# Patient Record
Sex: Male | Born: 1980 | Race: White | Hispanic: No | Marital: Married | Smoking: Never smoker
Health system: Southern US, Community
[De-identification: ages and names within clinical notes are randomized; demographics above are authoritative.]

## PROBLEM LIST (undated history)

## (undated) DIAGNOSIS — S3992XA Unspecified injury of lower back, initial encounter: Secondary | ICD-10-CM

## (undated) DIAGNOSIS — K253 Acute gastric ulcer without hemorrhage or perforation: Secondary | ICD-10-CM

## (undated) DIAGNOSIS — N200 Calculus of kidney: Secondary | ICD-10-CM

---
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
## 2011-07-20 ENCOUNTER — Encounter (HOSPITAL_BASED_OUTPATIENT_CLINIC_OR_DEPARTMENT_OTHER): Payer: Self-pay | Admitting: *Deleted

## 2011-07-20 ENCOUNTER — Emergency Department (HOSPITAL_BASED_OUTPATIENT_CLINIC_OR_DEPARTMENT_OTHER)
Admission: EM | Admit: 2011-07-20 | Discharge: 2011-07-20 | Disposition: A | Discharge status: Home / Self Care | Payer: No Typology Code available for payment source | Attending: Emergency Medicine | Admitting: Emergency Medicine

## 2011-07-20 DIAGNOSIS — J029 Acute pharyngitis, unspecified: Secondary | ICD-10-CM | POA: Insufficient documentation

## 2011-07-20 DIAGNOSIS — R300 Dysuria: Secondary | ICD-10-CM | POA: Insufficient documentation

## 2011-07-20 LAB — URINALYSIS, ROUTINE W REFLEX MICROSCOPIC
Glucose, UA: NEGATIVE mg/dL
Ketones, ur: 15 mg/dL — AB
Leukocytes, UA: NEGATIVE
Nitrite: NEGATIVE
Protein, ur: 30 mg/dL — AB
Specific Gravity, Urine: 1.03 (ref 1.005–1.030)
Urobilinogen, UA: 1 mg/dL (ref 0.0–1.0)
pH: 6 (ref 5.0–8.0)

## 2011-07-20 LAB — URINE MICROSCOPIC-ADD ON

## 2011-07-20 LAB — RAPID STREP SCREEN (MED CTR MEBANE ONLY): Streptococcus, Group A Screen (Direct): NEGATIVE

## 2011-07-20 MED ORDER — IBUPROFEN 800 MG PO TABS: 800.0000 mg | ORAL_TABLET | Freq: Three times a day (TID) | ORAL | Status: AC | PRN

## 2011-07-20 MED ORDER — CEPHALEXIN 500 MG PO CAPS: 500.0000 mg | ORAL_CAPSULE | Freq: Four times a day (QID) | ORAL | Status: AC

## 2011-07-20 NOTE — ED Notes (Signed)
Pt also c/o dysuria and low back pain x Wednesday.

## 2011-07-20 NOTE — ED Provider Notes (Signed)
Medical screening examination/treatment/procedure(s) were performed by non-physician practitioner and as supervising physician I was immediately available for consultation/collaboration.    Nelia Shi, MD 07/20/11 1247

## 2011-07-20 NOTE — ED Notes (Signed)
Pt amb to room 11 with quick steady gait in nad. Pt reports sore throat and fevers since last wed. Rapid strep obtained while pt being triaged.

## 2011-07-20 NOTE — ED Provider Notes (Signed)
History     CSN: [id number]  Arrival date & time 07/20/11  1119   First MD Initiated Contact with Patient 07/20/11 1208      Chief Complaint  Patient presents with  . Sore Throat  . Fever  . Dysuria    (Consider location/radiation/quality/duration/timing/severity/associated sxs/prior treatment) HPI Comments: Patient presents with fever and sore throat for the past 2 days. He has not had ear pain, runny nose, cough. Fever has been up to 102F. He has treated at home with ibuprofen which helps him sleep. In addition to the symptoms the patient also states that he has had bilateral lower back pain and some mild dysuria. He states that his urine has appeared dark. No generalized myalgias. Onset was gradual. Courses constant. No history of other medical problems. No history of sick contacts.  Patient is a 31 y.o. male presenting with pharyngitis. The history is provided by the patient.  Sore Throat This is a new problem. The current episode started in the past 7 days. The problem occurs constantly. The problem has been unchanged. Associated symptoms include a fever (to 102F), a sore throat, swollen glands and urinary symptoms. Pertinent negatives include no abdominal pain, chest pain, coughing, headaches, myalgias, nausea, neck pain, rash or vomiting. The symptoms are aggravated by swallowing. He has tried NSAIDs for the symptoms. The treatment provided mild relief.    History reviewed. No pertinent past medical history.  History reviewed. No pertinent past surgical history.  History reviewed. No pertinent family history.  History  Substance Use Topics  . Smoking status: Never Smoker   . Smokeless tobacco: Not on file  . Alcohol Use: No      Review of Systems  Constitutional: Positive for fever (to 102F).  HENT: Positive for sore throat. Negative for ear pain, rhinorrhea, trouble swallowing and neck pain.   Eyes: Negative for redness.  Respiratory: Negative for cough.     Cardiovascular: Negative for chest pain.  Gastrointestinal: Negative for nausea, vomiting, abdominal pain and diarrhea.  Genitourinary: Negative for dysuria, hematuria, flank pain and discharge.  Musculoskeletal: Positive for back pain. Negative for myalgias.  Skin: Negative for rash.  Neurological: Negative for headaches.  Hematological: Positive for adenopathy.    Allergies  Review of patient's allergies indicates no known allergies.  Home Medications  No current outpatient prescriptions on file.  BP 104/63  Pulse 94  Temp 101 F (38.3 C) (Oral)  Resp 14  Ht 5\' 7"  (1.702 m)  Wt 140 lb (63.504 kg)  BMI 21.93 kg/m2  SpO2 100%  Physical Exam  Nursing note and vitals reviewed. Constitutional: He appears well-developed and well-nourished.  HENT:  Head: Normocephalic and atraumatic.  Right Ear: Hearing, tympanic membrane and ear canal normal.  Left Ear: Hearing, tympanic membrane and ear canal normal.  Nose: Nose normal. No mucosal edema.  Mouth/Throat: Mucous membranes are not dry. No uvula swelling. Oropharyngeal exudate and posterior oropharyngeal erythema present. No posterior oropharyngeal edema or tonsillar abscesses.       No peritonsillar abscess  Eyes: Conjunctivae are normal. Pupils are equal, round, and reactive to light. Right eye exhibits no discharge. Left eye exhibits no discharge.  Neck: Normal range of motion. Neck supple.  Cardiovascular: Normal rate, regular rhythm and normal heart sounds.   Pulmonary/Chest: Effort normal and breath sounds normal.  Abdominal: Soft. There is no tenderness.  Lymphadenopathy:    He has cervical adenopathy.  Neurological: He is alert.  Skin: Skin is warm and dry.  Psychiatric: He  has a normal mood and affect.    ED Course  Procedures (including critical care time)  Labs Reviewed  URINALYSIS, ROUTINE W REFLEX MICROSCOPIC - Abnormal; Notable for the following:    Color, Urine AMBER (*)  BIOCHEMICALS MAY BE AFFECTED BY  COLOR   Hgb urine dipstick MODERATE (*)     Bilirubin Urine SMALL (*)     Ketones, ur 15 (*)     Protein, ur 30 (*)     All other components within normal limits  URINE MICROSCOPIC-ADD ON - Abnormal; Notable for the following:    Bacteria, UA MANY (*)     All other components within normal limits  RAPID STREP SCREEN  URINE CULTURE   No results found.   1. Pharyngitis   2. Dysuria     12:29 PM Patient seen and examined. Work-up initiated. Medications ordered.   Vital signs reviewed and are as follows: Filed Vitals:   07/20/11 1131  BP: 104/63  Pulse: 94  Temp: 101 F (38.3 C)  Resp: 14   Patient urged to return with worsening symptoms, persistent high fever or vomiting. Urged return if not feeling better in 3 days. He states understanding and agrees with plan.    MDM  [facility] 4/4 -- will treat even with neg strep. No concern for peritonsillar abscess or retropharyngeal abscess.  Patient with dysuria, some blood, + bacteria -- however 0-2 WBC. Given symptoms will treat, send culture. Doubt post-streptococcal glomerular nephritis given bacteria noted, pain, 2 days since onset of symptoms.         Renne Crigler, [place] 07/20/11 1235

## 2011-07-21 LAB — URINE CULTURE
Colony Count: NO GROWTH
Culture: NO GROWTH

## 2012-03-11 ENCOUNTER — Other Ambulatory Visit: Payer: Self-pay | Admitting: Occupational Medicine

## 2012-03-11 ENCOUNTER — Ambulatory Visit: Payer: Self-pay

## 2012-03-11 DIAGNOSIS — M25532 Pain in left wrist: Secondary | ICD-10-CM

## 2012-12-01 ENCOUNTER — Ambulatory Visit (INDEPENDENT_AMBULATORY_CARE_PROVIDER_SITE_OTHER): Payer: No Typology Code available for payment source | Admitting: Internal Medicine

## 2012-12-01 VITALS — BP 112/72 | HR 78 | Temp 98.8°F | Resp 18 | Ht 65.75 in | Wt 138.0 lb

## 2012-12-01 DIAGNOSIS — M546 Pain in thoracic spine: Secondary | ICD-10-CM

## 2012-12-01 DIAGNOSIS — M542 Cervicalgia: Secondary | ICD-10-CM

## 2012-12-01 MED ORDER — MELOXICAM 15 MG PO TABS: 15.0000 mg | ORAL_TABLET | Freq: Every day | ORAL | Status: DC

## 2012-12-01 MED ORDER — PREDNISONE 20 MG PO TABS: ORAL_TABLET | ORAL | Status: DC

## 2012-12-01 MED ORDER — TRAMADOL HCL 50 MG PO TABS: 100.0000 mg | ORAL_TABLET | Freq: Two times a day (BID) | ORAL | Status: DC

## 2012-12-01 MED ORDER — CYCLOBENZAPRINE HCL 10 MG PO TABS: 10.0000 mg | ORAL_TABLET | Freq: Every day | ORAL | Status: DC

## 2012-12-01 NOTE — Progress Notes (Signed)
Subjective:    Patient ID: James Hines, male    DOB: 08-27-1980, 32 y.o.   MRN: [id number] This chart was scribed for James Chick, MD by Clydene Laming, ED Scribe. This patient was seen in room 14 and the patient's care was started at 9:05 PM. HPI HPI Comments: James Hines is a 32 y.o. male who presents to the [facility] complaining of back pain radiating to the neck onset last week. He states symptoms worsened this weekend and he could not work as he normally could today. He was havingsome trouble bending over and picking up items. Pt denies any trauma that could have caused the pain. Pt is having some pain when sleeping. Pt denies sob. Denies prior mvc crash. He sometimes has pain when taking deep breaths. Pt does not have a hx of back pain and states he does not usually have problems with his neck.    There are no active problems to display for this patient.  History reviewed. No pertinent past medical history. History reviewed. No pertinent past surgical history. No Known Allergies Prior to Admission medications   Not on File   nonsmoker married    Review of Systems  Respiratory: Negative for shortness of breath.   Musculoskeletal: Positive for back pain. Negative for gait problem, neck pain and neck stiffness.  Neurological: Negative for weakness and numbness.       Objective:   Physical Exam  Nursing note and vitals reviewed. Constitutional: He is oriented to person, place, and time. He appears well-developed and well-nourished. No distress.  HENT:  Head: Normocephalic.  Mouth/Throat: Oropharynx is clear and moist.  Neck: Normal range of motion.  Full ROM without pain  Cardiovascular: Normal rate.   Pulmonary/Chest: Effort normal.  Musculoskeletal: He exhibits tenderness.  Full range of motion of the T-spine and L-spine No tenderness to palpation of the spinous processes of the T-spine or L-spine Mild tenderness to palpation of the L  scapular border Tender to palp deep in L paracerv muscles Neck rom =pain w/ flex/extens and rot but has full rom  Neurological: He is alert and oriented to person, place, and time. He has normal reflexes. No cranial nerve deficit.  Speech is clear and goal oriented, follows commands Normal strength in upper and lower extremities bilaterally including dorsiflexion and plantar flexion, strong and equal grip strength Sensation normal to light and sharp touch Moves extremities without ataxia, coordination intact Normal gait Normal balance   Psychiatric: He has a normal mood and affect.      Assessment & Plan:  Neck pain with Thoracic back pain ? Radicular versus strain with spasm  Heat/gentle rom/light duty/ Reck 1 week Meds ordered this encounter  Medications  . predniSONE (DELTASONE) 20 MG tablet    Sig: 3/3/2/2/1/1 single daily dose for 6 days    Dispense:  12 tablet    Refill:  0  . meloxicam (MOBIC) 15 MG tablet    Sig: Take 1 tablet (15 mg total) by mouth daily.    Dispense:  30 tablet    Refill:  0  . cyclobenzaprine (FLEXERIL) 10 MG tablet    Sig: Take 1 tablet (10 mg total) by mouth at bedtime.    Dispense:  30 tablet    Refill:  0  . traMADol (ULTRAM) 50 MG tablet    Sig: Take 2 tablets (100 mg total) by mouth 2 (two) times daily. For pain    Dispense:  14 tablet  Refill:  0     I have completed the patient encounter in its entirety as documented by the scribe, with editing by me where necessary. Macenzie Burford P. Merla Riches, M.D.

## 2013-01-18 ENCOUNTER — Ambulatory Visit (INDEPENDENT_AMBULATORY_CARE_PROVIDER_SITE_OTHER): Payer: 59 | Admitting: Internal Medicine

## 2013-01-18 VITALS — BP 100/68 | HR 70 | Temp 98.4°F | Resp 16 | Ht 65.0 in | Wt 132.6 lb

## 2013-01-18 DIAGNOSIS — R519 Headache, unspecified: Secondary | ICD-10-CM

## 2013-01-18 DIAGNOSIS — R51 Headache: Secondary | ICD-10-CM

## 2013-01-18 MED ORDER — ONDANSETRON HCL 4 MG PO TABS: 4.0000 mg | ORAL_TABLET | Freq: Three times a day (TID) | ORAL | Status: DC | PRN

## 2013-01-18 MED ORDER — SUMATRIPTAN SUCCINATE 100 MG PO TABS: ORAL_TABLET | ORAL | Status: DC

## 2013-01-18 MED ORDER — BUTALBITAL-APAP-CAFFEINE 50-325-40 MG PO TABS
1.0000 | ORAL_TABLET | Freq: Two times a day (BID) | ORAL | Status: DC | PRN
Start: 2013-01-18 — End: 2016-02-02

## 2013-01-18 NOTE — Patient Instructions (Signed)
1-2 tabs as needed for HA 2-3 times a day of the esgic  Zofran as for the nausea associated with the headache you should be feeling much better within the week and if the headaches return you'll have a chance to try Imitrex at the onset of the headache It is okay to use Esgic and Zofran if Imitrex doesn't work Recheck in 3-4 weeks if this headache syndrome has not been completely controlled

## 2013-01-18 NOTE — Progress Notes (Signed)
Subjective:    Patient ID: James Hectoraron L Roepke, male    DOB: 03/10/1980, 33 y.o.   MRN: [id number]  HPI This chart was scribed for [facility] Hospital[facility] Doolittle-MD, by Ladona Ridgelaylor Day, Scribe. This patient was seen in room 9 and the patient's care was started at 3:29 PM.  HPI Comments: James Hines is a 33 y.o. male who presents to the [facility] w/hx of headaches complaining of waxing and waning right frontal HA, onset 3 weeks ago. He reports that these episodes of headaches are similar to his previous episodes except he has never had them last for this long. He states when HA is at its worse, movement makes it worse and it feels like a stabbing pain across his forehead. He reports associated nausea, dizziness at worse times of his HA, reports some intermittent nasal congesiton. He reports some associated neck stiffness and muslce tension at time of HA (decreased appetite secondary to HA). He denies any precipitating factors to his HA and denies any visual aura at the onset of his HA, no auditory problems.   He has tried goody powder and excedrin w/out any relief. He states HA interferes w/his sleeping as well. He reports his last episode of HA was about 1 year ago but not as bad as this episode. He denies any blurry vision (he gets some blurry vision when he blinks), he denies sore throat, fever, sneezing, cough. He has not had the flu this year (no fevers past couple of months).  His wife reports that he has been snoring more so than normal lately and also has periods of ? apnea. He reports that at work lately, he has been more tired than normal and also feels tired while driving and falls asleep easier while resting at home.   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    There are no active problems to display for this patient.   History reviewed. No pertinent past surgical history.  History reviewed. No pertinent family history.  History   Social  History  . Marital Status: Married    Spouse Name: N/A    Number of Children: N/A  . Years of Education: N/A   Occupational History  . Not on file.   Social History Main Topics  . Smoking status: Never Smoker   . Smokeless tobacco: Not on file  . Alcohol Use: No  . Drug Use: No  . Sexual Activity: Not on file   Other Topics Concern  . Not on file   Social History Narrative  . No narrative on file    No Known Allergies   Review of Systems  Constitutional: Positive for appetite change. Negative for fever and chills.  HENT: Negative for congestion, rhinorrhea and sore throat.   Respiratory: Negative for cough and shortness of breath.   Cardiovascular: Negative for chest pain.  Gastrointestinal: Negative for nausea, vomiting and abdominal pain.  Musculoskeletal: Negative for back pain.  Neurological: Positive for dizziness and headaches.       Objective:   Physical Exam  Nursing note and vitals reviewed. Constitutional: He is oriented to person, place, and time. He appears well-developed and well-nourished. No distress.  HENT:  Head: Normocephalic and atraumatic.  Right Ear: External ear normal.  Left Ear: External ear normal.  Nose: Nose normal.  Mouth/Throat: Oropharynx is clear and moist.  Eyes: Conjunctivae and EOM are normal. Pupils are equal, round, and reactive to light. Right eye  exhibits no discharge. Left eye exhibits no discharge.  Neck: Normal range of motion. Neck supple. No thyromegaly present.  Cardiovascular: Normal rate, regular rhythm, normal heart sounds and intact distal pulses.   No murmur heard. Pulmonary/Chest: Effort normal and breath sounds normal. No respiratory distress.  Musculoskeletal: Normal range of motion. He exhibits no edema.  Lymphadenopathy:    He has no cervical adenopathy.  Neurological: He is alert and oriented to person, place, and time. He has normal reflexes. No cranial nerve deficit. He exhibits normal muscle tone.  Coordination normal.  Skin: Skin is warm and dry. No rash noted.  Psychiatric: He has a normal mood and affect. His behavior is normal. Judgment and thought content normal.    Triage Vitals: BP 100/68  Pulse 70  Temp(Src) 98.4 F (36.9 C) (Oral)  Resp 16  Ht 5\' 5"  (1.651 m)  Wt 132 lb 9.6 oz (60.147 kg)  BMI 22.07 kg/m2  SpO2 100%         Assessment & Plan:  I have completed the patient encounter in its entirety as documented by the scribe, with editing by me where necessary. Robert P. Merla Riches, M.D.  HA (headache) ? Atypical migraine  Meds ordered this encounter  Medications  . SUMAtriptan (IMITREX) 100 MG tablet    Sig: Take 1 tablet within 15-30 minutes of the onset of a headache. No more than 2 tablets in 24 hours    Dispense:  2 tablet    Refill:  0  . ondansetron (ZOFRAN) 4 MG tablet    Sig: Take 1 tablet (4 mg total) by mouth every 8 (eight) hours as needed for nausea or vomiting.    Dispense:  20 tablet    Refill:  0  . butalbital-acetaminophen-caffeine (ESGIC) 50-325-40 MG per tablet    Sig: Take 1-2 tablets by mouth 2 (two) times daily as needed for headache.    Dispense:  14 tablet    Refill:  0   Try to stabilize headache with acute treatment with Esgic and Zofran and then use Imitrex at the onset of his next series of headaches Followup in 4 weeks/sooner if worse

## 2013-01-30 ENCOUNTER — Ambulatory Visit (INDEPENDENT_AMBULATORY_CARE_PROVIDER_SITE_OTHER): Payer: 59 | Admitting: Emergency Medicine

## 2013-01-30 VITALS — BP 120/68 | HR 81 | Temp 98.0°F | Resp 16 | Ht 65.0 in | Wt 132.0 lb

## 2013-01-30 DIAGNOSIS — G43109 Migraine with aura, not intractable, without status migrainosus: Secondary | ICD-10-CM

## 2013-01-30 DIAGNOSIS — G44019 Episodic cluster headache, not intractable: Secondary | ICD-10-CM

## 2013-01-30 MED ORDER — ELETRIPTAN HYDROBROMIDE 40 MG PO TABS: 40.0000 mg | ORAL_TABLET | ORAL | Status: DC | PRN

## 2013-01-30 NOTE — Progress Notes (Signed)
[facility] [address], [place] [address] [phone]  Date:  01/30/2013   Name:  James Hines   DOB:  1980-04-15   MRN:  [id number]  PCP:  No primary provider on file.    Chief Complaint: Headache   History of Present Illness:  James Hines is a 33 y.o. very pleasant male patient who presents with the following:  History of migraine headaches through adult life.  Says he has not experienced a severe headache in the past year.  Says his headaches come on located in the right temporofrontal region and pulsate.  No vomiting but nauseated. No LOC or antecedent illness or injury to head.  No fever or chills.  Was in two weeks ago and put on imitrex, zofran and esgic with no improvement in his headaches.  Says the imitrex makes it worse.  Has photophobia and loud noises bother him.  No neuro or visual changes associated with headaches.  No improvement with over the counter medications or other home remedies. Denies other complaint or health concern today.   There are no active problems to display for this patient.   History reviewed. No pertinent past medical history.  History reviewed. No pertinent past surgical history.  History  Substance Use Topics  . Smoking status: Never Smoker   . Smokeless tobacco: Not on file  . Alcohol Use: No    History reviewed. No pertinent family history.  No Known Allergies  Medication list has been reviewed and updated.  Current Outpatient Prescriptions on File Prior to Visit  Medication Sig Dispense Refill  . butalbital-acetaminophen-caffeine (ESGIC) 50-325-40 MG per tablet Take 1-2 tablets by mouth 2 (two) times daily as needed for headache.  14 tablet  0  . ondansetron (ZOFRAN) 4 MG tablet Take 1 tablet (4 mg total) by mouth every 8 (eight) hours as needed for nausea or vomiting.  20 tablet  0  . SUMAtriptan (IMITREX) 100 MG tablet Take 1 tablet within 15-30 minutes of the onset of a headache. No more than 2  tablets in 24 hours  2 tablet  0  . cyclobenzaprine (FLEXERIL) 10 MG tablet Take 1 tablet (10 mg total) by mouth at bedtime.  30 tablet  0  . meloxicam (MOBIC) 15 MG tablet Take 1 tablet (15 mg total) by mouth daily.  30 tablet  0  . predniSONE (DELTASONE) 20 MG tablet 3/3/2/2/1/1 single daily dose for 6 days  12 tablet  0  . traMADol (ULTRAM) 50 MG tablet Take 2 tablets (100 mg total) by mouth 2 (two) times daily. For pain  14 tablet  0   No current facility-administered medications on file prior to visit.    Review of Systems:  As per HPI, otherwise negative.    Physical Examination: Filed Vitals:   01/30/13 1551  BP: 120/68  Pulse: 81  Temp: 98 F (36.7 C)  Resp: 16   Filed Vitals:   01/30/13 1551  Height: 5\' 5"  (1.651 m)  Weight: 132 lb (59.875 kg)   Body mass index is 21.97 kg/(m^2). Ideal Body Weight: Weight in (lb) to have BMI = 25: 149.9  GEN: WDWN, NAD, Non-toxic, A & O x 3 HEENT: Atraumatic, Normocephalic. Neck supple. No masses, No LAD. Ears and Nose: No external deformity. CV: RRR, No M/G/R. No JVD. No thrill. No extra heart sounds. PULM: CTA B, no wheezes, crackles, rhonchi. No retractions. No resp. distress. No accessory muscle use. ABD: S, NT, ND, +  BS. No rebound. No HSM. EXTR: No c/c/e NEURO Normal gait.  PSYCH: Normally interactive. Conversant. Not depressed or anxious appearing.  Calm demeanor.    Assessment and Plan: Cluster headache relpax   Signed,  Phillips OdorJeffery Makayle Krahn, MD

## 2013-01-30 NOTE — Patient Instructions (Signed)
Cluster Cluster Headache Cluster headaches are recognized by their pattern of deep, intense head pain. They normally occur on one side of your head, but they may "switch sides" in subsequent episodes. Typically, cluster headaches:   Are severe in nature.   Occur repeatedly over weeks to months and are followed by periods of no headaches.   Can last from 15 minutes to 3 hours.   Occur at the same time each day, often at night.   Occur several times a day. CAUSES The exact cause of cluster headaches is not known. Alcohol use may be associated with cluster headaches. SIGNS AND SYMPTOMS   Severe pain that begins in or around your eye or temple.   One-sided head pain.   Feeling sick to your stomach (nauseous).   Sensitivity to light.   Runny nose.   Eye redness, tearing, and nasal stuffiness on the side of your head where you are experiencing pain.   Sweaty, pale skin of the face.   Droopy or swollen eyelid.   Restlessness. DIAGNOSIS  Cluster headaches are diagnosed based on symptoms and a physical exam. Your health care provider may order a CT scan or an MRI of your head or lab tests to see if your headaches are caused by other medical conditions.  TREATMENT   Medicines for pain relief and to prevent recurrent attacks. Some people may need a combination of medicines.  Oxygen for pain relief.   Biofeedback programs to help reduce headache pain.  It may be helpful to keep a headache diary. This may help you find a trend for what is triggering your headaches. Your health care provider can develop a treatment plan.  HOME CARE INSTRUCTIONS  During cluster periods:   Follow a regular sleep schedule. Do not vary the amount and time that you sleep from day to day. It is important to stay on the same schedule during a cluster period to help prevent headaches.   Avoid alcohol.   Stop smoking if you smoke.  SEEK MEDICAL CARE IF:  You have any changes from your  previous cluster headaches either in intensity or frequency.   You are not getting relief from medicines you are taking.  SEEK IMMEDIATE MEDICAL CARE IF:   You faint.   You have weakness or numbness, especially on one side of your body or face.   You have double vision.   You have nausea or vomiting that is not relieved within several hours.   You cannot keep your balance or have difficulty talking or walking.   You have neck pain or stiffness.   You have a fever. MAKE SURE YOU:  Understand these instructions.   Will watch your condition.   Will get help right away if you are not doing well or get worse. Document Released: 01/01/2005 Document Revised: 10/22/2012 Document Reviewed: 07/24/2012 [facility] Patient Information 2014 [employer], [place].

## 2013-07-15 ENCOUNTER — Ambulatory Visit (INDEPENDENT_AMBULATORY_CARE_PROVIDER_SITE_OTHER): Payer: 59 | Admitting: Emergency Medicine

## 2013-07-15 VITALS — BP 122/84 | HR 70 | Temp 97.8°F | Resp 16 | Ht 65.25 in | Wt 130.6 lb

## 2013-07-15 DIAGNOSIS — G5680 Other specified mononeuropathies of unspecified upper limb: Secondary | ICD-10-CM

## 2013-07-15 DIAGNOSIS — G5682 Other specified mononeuropathies of left upper limb: Secondary | ICD-10-CM

## 2013-07-15 MED ORDER — CYCLOBENZAPRINE HCL 10 MG PO TABS: 10.0000 mg | ORAL_TABLET | Freq: Three times a day (TID) | ORAL | Status: DC

## 2013-07-15 MED ORDER — ACETAMINOPHEN-CODEINE #3 300-30 MG PO TABS: 1.0000 | ORAL_TABLET | ORAL | Status: DC | PRN

## 2013-07-15 MED ORDER — NAPROXEN SODIUM 550 MG PO TABS: 550.0000 mg | ORAL_TABLET | Freq: Two times a day (BID) | ORAL | Status: AC

## 2013-07-15 NOTE — Patient Instructions (Signed)

## 2013-07-15 NOTE — Progress Notes (Signed)
[facility] [address], [place] [phone]  Date:  07/15/2013   Name:  James Hines   DOB:  11/30/1980   MRN:  [id number]  PCP:  No PCP Per Patient    Chief Complaint: Back Pain   History of Present Illness:  James Hines is a 33 y.o. very pleasant male patient who presents with the following:  Awoke in the night with pain in left posterior shoulder.  Kept him from sleeping.  Pain increases with movement and deep breath.  Decreases with rest.  No radiation.  Denies injury or specific overuse.  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  No cough or fever.  No wheezing or shortness of breath. No nausea or vomiting.  No stool change.  No improvement with over the counter medications or other home remedies. Denies other complaint or health concern today.   There are no active problems to display for this patient.   History reviewed. No pertinent past medical history.  History reviewed. No pertinent past surgical history.  History  Substance Use Topics  . Smoking status: Never Smoker   . Smokeless tobacco: Not on file  . Alcohol Use: No    History reviewed. No pertinent family history.  No Known Allergies  Medication list has been reviewed and updated.  Current Outpatient Prescriptions on File Prior to Visit  Medication Sig Dispense Refill  . butalbital-acetaminophen-caffeine (ESGIC) 50-325-40 MG per tablet Take 1-2 tablets by mouth 2 (two) times daily as needed for headache.  14 tablet  0  . cyclobenzaprine (FLEXERIL) 10 MG tablet Take 1 tablet (10 mg total) by mouth at bedtime.  30 tablet  0  . eletriptan (RELPAX) 40 MG tablet Take 1 tablet (40 mg total) by mouth as needed for migraine or headache. One tablet by mouth at onset of headache. May repeat in 2 hours if headache persists or recurs.  10 tablet  5  . meloxicam (MOBIC) 15 MG tablet Take 1 tablet (15 mg total) by mouth daily.  30 tablet  0  . ondansetron (ZOFRAN) 4 MG  tablet Take 1 tablet (4 mg total) by mouth every 8 (eight) hours as needed for nausea or vomiting.  20 tablet  0  . predniSONE (DELTASONE) 20 MG tablet 3/3/2/2/1/1 single daily dose for 6 days  12 tablet  0  . SUMAtriptan (IMITREX) 100 MG tablet Take 1 tablet within 15-30 minutes of the onset of a headache. No more than 2 tablets in 24 hours  2 tablet  0  . traMADol (ULTRAM) 50 MG tablet Take 2 tablets (100 mg total) by mouth 2 (two) times daily. For pain  14 tablet  0   No current facility-administered medications on file prior to visit.    Review of Systems:  As per HPI, otherwise negative.    Physical Examination: Filed Vitals:   07/15/13 0834  BP: 122/84  Pulse: 70  Temp: 97.8 F (36.6 C)  Resp: 16   Filed Vitals:   07/15/13 0834  Height: 5' 5.25" (1.657 m)  Weight: 130 lb 9.6 oz (59.24 kg)   Body mass index is 21.58 kg/(m^2). Ideal Body Weight: Weight in (lb) to have BMI = 25: 151.1   GEN: WDWN, NAD, Non-toxic, Alert & Oriented x 3 HEENT: Atraumatic, Normocephalic.  Ears and Nose: No external deformity. EXTR: No clubbing/cyanosis/edema NEURO: Normal gait.  PSYCH: Normally interactive. Conversant. Not depressed or anxious appearing.  Calm demeanor.  BACK:  Left upper back tenderness inferior and medial to angle of scapula. No ecchymosis or abrasion.   CHEST clear BS= COR:  RRR no m  Assessment and Plan: Scapulocostal syndrome Anaprox Flexeril tyl #3 Ice  Signed,  Phillips OdorJeffery Zane Samson, MD

## 2014-09-06 ENCOUNTER — Ambulatory Visit (INDEPENDENT_AMBULATORY_CARE_PROVIDER_SITE_OTHER): Payer: 59 | Admitting: Emergency Medicine

## 2014-09-06 ENCOUNTER — Ambulatory Visit
Admission: RE | Admit: 2014-09-06 | Discharge: 2014-09-06 | Disposition: A | Discharge status: Home / Self Care | Payer: 59 | Source: Ambulatory Visit | Attending: Emergency Medicine | Admitting: Emergency Medicine

## 2014-09-06 ENCOUNTER — Ambulatory Visit (INDEPENDENT_AMBULATORY_CARE_PROVIDER_SITE_OTHER): Payer: 59

## 2014-09-06 ENCOUNTER — Telehealth: Payer: Self-pay | Admitting: Emergency Medicine

## 2014-09-06 VITALS — BP 122/80 | HR 69 | Temp 98.5°F | Resp 16 | Ht 65.0 in | Wt 138.4 lb

## 2014-09-06 DIAGNOSIS — R81 Glycosuria: Secondary | ICD-10-CM | POA: Diagnosis not present

## 2014-09-06 DIAGNOSIS — R10814 Left lower quadrant abdominal tenderness: Secondary | ICD-10-CM | POA: Diagnosis not present

## 2014-09-06 DIAGNOSIS — R109 Unspecified abdominal pain: Secondary | ICD-10-CM | POA: Diagnosis not present

## 2014-09-06 DIAGNOSIS — R319 Hematuria, unspecified: Secondary | ICD-10-CM | POA: Diagnosis not present

## 2014-09-06 DIAGNOSIS — R198 Other specified symptoms and signs involving the digestive system and abdomen: Secondary | ICD-10-CM

## 2014-09-06 LAB — POCT UA - MICROSCOPIC ONLY
Bacteria, U Microscopic: NEGATIVE
Casts, Ur, LPF, POC: NEGATIVE
Crystals, Ur, HPF, POC: NEGATIVE
Mucus, UA: POSITIVE
WBC, Ur, HPF, POC: NEGATIVE
Yeast, UA: NEGATIVE

## 2014-09-06 LAB — POCT CBC
Granulocyte percent: 61.4 %G (ref 37–80)
HCT, POC: 47.2 % (ref 43.5–53.7)
Hemoglobin: 15.5 g/dL (ref 14.1–18.1)
Lymph, poc: 1.9 (ref 0.6–3.4)
MCH, POC: 28.2 pg (ref 27–31.2)
MCHC: 32.9 g/dL (ref 31.8–35.4)
MCV: 85.7 fL (ref 80–97)
MID (cbc): 0.5 (ref 0–0.9)
MPV: 6.7 fL (ref 0–99.8)
POC Granulocyte: 3.9 (ref 2–6.9)
POC LYMPH PERCENT: 30.5 %L (ref 10–50)
POC MID %: 8.1 %M (ref 0–12)
Platelet Count, POC: 212 10*3/uL (ref 142–424)
RBC: 5.5 M/uL (ref 4.69–6.13)
RDW, POC: 12.2 %
WBC: 6.3 10*3/uL (ref 4.6–10.2)

## 2014-09-06 LAB — POCT URINALYSIS DIPSTICK
Bilirubin, UA: NEGATIVE
Glucose, UA: 100
Ketones, UA: NEGATIVE
Leukocytes, UA: NEGATIVE
Nitrite, UA: NEGATIVE
Protein, UA: NEGATIVE
Spec Grav, UA: 1.025
Urobilinogen, UA: 0.2
pH, UA: 6

## 2014-09-06 LAB — GLUCOSE, POCT (MANUAL RESULT ENTRY): POC Glucose: 97 mg/dl (ref 70–99)

## 2014-09-06 NOTE — Patient Instructions (Signed)
Hematuria  Hematuria is blood in your urine. It can be caused by a bladder infection, kidney infection, prostate infection, kidney stone, or cancer of your urinary tract. Infections can usually be treated with medicine, and a kidney stone usually will pass through your urine. If neither of these is the cause of your hematuria, further workup to find out the reason may be needed.  It is very important that you tell your health care provider about any blood you see in your urine, even if the blood stops without treatment or happens without causing pain. Blood in your urine that happens and then stops and then happens again can be a symptom of a very serious condition. Also, pain is not a symptom in the initial stages of many urinary cancers.  HOME CARE INSTRUCTIONS    Drink lots of fluid, 3-4 quarts a day. If you have been diagnosed with an infection, cranberry juice is especially recommended, in addition to large amounts of water.   Avoid caffeine, tea, and carbonated beverages because they tend to irritate the bladder.   Avoid alcohol because it may irritate the prostate.   Take all medicines as directed by your health care provider.   If you were prescribed an antibiotic medicine, finish it all even if you start to feel better.   If you have been diagnosed with a kidney stone, follow your health care provider's instructions regarding straining your urine to catch the stone.   Empty your bladder often. Avoid holding urine for long periods of time.   After a bowel movement, women should cleanse front to back. Use each tissue only once.   Empty your bladder before and after sexual intercourse if you are a male.  SEEK MEDICAL CARE IF:   You develop back pain.   You have a fever.   You have a feeling of sickness in your stomach (nausea) or vomiting.   Your symptoms are not better in 3 days. Return sooner if you are getting worse.  SEEK IMMEDIATE MEDICAL CARE IF:    You develop severe vomiting and are  unable to keep the medicine down.   You develop severe back or abdominal pain despite taking your medicines.   You begin passing a large amount of blood or clots in your urine.   You feel extremely weak or faint, or you pass out.  MAKE SURE YOU:    Understand these instructions.   Will watch your condition.   Will get help right away if you are not doing well or get worse.  Document Released: 01/01/2005 Document Revised: 05/18/2013 Document Reviewed: 09/01/2012  ExitCare Patient Information 2015 [employer]. This information is not intended to replace advice given to you by your health care provider. Make sure you discuss any questions you have with your health care provider.  Abdominal Pain  Many things can cause abdominal pain. Usually, abdominal pain is not caused by a disease and will improve without treatment. It can often be observed and treated at home. Your health care provider will do a physical exam and possibly order blood tests and X-rays to help determine the seriousness of your pain. However, in many cases, more time must pass before a clear cause of the pain can be found. Before that point, your health care provider may not know if you need more testing or further treatment.  HOME CARE INSTRUCTIONS   Monitor your abdominal pain for any changes. The following actions may help to alleviate any discomfort you are   experiencing:   Only take over-the-counter or prescription medicines as directed by your health care provider.   Do not take laxatives unless directed to do so by your health care provider.   Try a clear liquid diet (broth, tea, or water) as directed by your health care provider. Slowly move to a bland diet as tolerated.  SEEK MEDICAL CARE IF:   You have unexplained abdominal pain.   You have abdominal pain associated with nausea or diarrhea.   You have pain when you urinate or have a bowel movement.   You experience abdominal pain that wakes you in the night.   You have  abdominal pain that is worsened or improved by eating food.   You have abdominal pain that is worsened with eating fatty foods.   You have a fever.  SEEK IMMEDIATE MEDICAL CARE IF:    Your pain does not go away within 2 hours.   You keep throwing up (vomiting).   Your pain is felt only in portions of the abdomen, such as the right side or the left lower portion of the abdomen.   You pass bloody or black tarry stools.  MAKE SURE YOU:   Understand these instructions.    Will watch your condition.    Will get help right away if you are not doing well or get worse.   Document Released: 10/11/2004 Document Revised: 01/06/2013 Document Reviewed: 09/10/2012  ExitCare Patient Information 2015 [employer]. This information is not intended to replace advice given to you by your health care provider. Make sure you discuss any questions you have with your health care provider.

## 2014-09-06 NOTE — Progress Notes (Addendum)
Patient ID: James Hines, male   DOB: [birth year], 34 y.o.   MRN: [id number]    This chart was scribed for Earl Lites, MD by [facility], medical scribe at [facility].The patient was seen in exam room 04 and the patient's care was started at 1:58 PM.  Chief Complaint:  Chief Complaint  Patient presents with  . Back Pain    x 1 week ago  . Abdominal Pain   HPI: James Hines is a 34 y.o. male who reports to [facility] today complaining of lower back pain on the left side for one week. Yesterday he had urinary urgency and abdominal pain. He describes the pain as very "achy". No fever. No history of diverticulitis. Family history of kidney stones.  History reviewed. No pertinent past medical history. History reviewed. No pertinent past surgical history. Social History   Social History  . Marital Status: Married    Spouse Name: N/A  . Number of Children: N/A  . Years of Education: N/A   Social History Main Topics  . Smoking status: Never Smoker   . Smokeless tobacco: Current User  . Alcohol Use: No  . Drug Use: No  . Sexual Activity: Not Asked   Other Topics Concern  . None   Social History Narrative   History reviewed. No pertinent family history. No Known Allergies Prior to Admission medications   Medication Sig Start Date End Date Taking? Authorizing Provider  acetaminophen-codeine (TYLENOL #3) 300-30 MG per tablet Take 1-2 tablets by mouth every 4 (four) hours as needed. Patient not taking: Reported on 09/06/2014 07/15/13   Carmelina Dane, MD  butalbital-acetaminophen-caffeine [facility]) [phone] MG per tablet Take 1-2 tablets by mouth 2 (two) times daily as needed for headache. Patient not taking: Reported on 09/06/2014 01/18/13   Tonye Pearson, MD  cyclobenzaprine (FLEXERIL) 10 MG tablet Take 1 tablet (10 mg total) by mouth 3 (three) times daily. Patient not taking: Reported on 09/06/2014 07/15/13   Carmelina Dane, MD  eletriptan (RELPAX) 40 MG  tablet Take 1 tablet (40 mg total) by mouth as needed for migraine or headache. One tablet by mouth at onset of headache. May repeat in 2 hours if headache persists or recurs. Patient not taking: Reported on 09/06/2014 01/30/13   Carmelina Dane, MD  meloxicam (MOBIC) 15 MG tablet Take 1 tablet (15 mg total) by mouth daily. Patient not taking: Reported on 09/06/2014 12/01/12   Tonye Pearson, MD  ondansetron (ZOFRAN) 4 MG tablet Take 1 tablet (4 mg total) by mouth every 8 (eight) hours as needed for nausea or vomiting. Patient not taking: Reported on 09/06/2014 01/18/13   Tonye Pearson, MD  predniSONE (DELTASONE) 20 MG tablet 3/3/2/2/1/1 single daily dose for 6 days Patient not taking: Reported on 09/06/2014 12/01/12   Tonye Pearson, MD  SUMAtriptan (IMITREX) 100 MG tablet Take 1 tablet within 15-30 minutes of the onset of a headache. No more than 2 tablets in 24 hours Patient not taking: Reported on 09/06/2014 01/18/13   Tonye Pearson, MD  traMADol (ULTRAM) 50 MG tablet Take 2 tablets (100 mg total) by mouth 2 (two) times daily. For pain Patient not taking: Reported on 09/06/2014 12/01/12   Tonye Pearson, MD   ROS: The patient denies fevers, chills, night sweats, unintentional weight loss, chest pain, palpitations, wheezing, dyspnea on exertion, nausea, vomiting, abdominal pain, dysuria, hematuria, melena, numbness, weakness, or tingling.  All other systems have been  reviewed and were otherwise negative with the exception of those mentioned in the HPI and as above.    PHYSICAL EXAM: Filed Vitals:   09/06/14 1331  BP: 122/80  Pulse: 69  Temp: 98.5 F (36.9 C)  Resp: 16   Body mass index is 23.03 kg/(m^2).  General: Alert, no acute distress HEENT:  Normocephalic, atraumatic, oropharynx patent. Eye: [facility] Cardiovascular:  Regular rate and rhythm, no rubs murmurs or gallops.  No Carotid bruits, radial pulse intact. No pedal edema.  Respiratory: Clear to  auscultation bilaterally.  No wheezes, rales, or rhonchi.  No cyanosis, no use of accessory musculature Abdominal: No organomegaly, abdomen is soft, positive bowel sounds.  No masses. Tenderness deep left lower quadrant.  Musculoskeletal: Gait intact. No edema, tenderness Skin: No rashes. Neurologic: Facial musculature symmetric. Psychiatric: Patient acts appropriately throughout our interaction. Lymphatic: No cervical or submandibular lymphadenopathy Genitourinary/Anorectal: No acute findings  LABS: Results for orders placed or performed in visit on 09/06/14  POCT CBC  Result Value Ref Range   WBC 6.3 4.6 - 10.2 K/uL   Lymph, poc 1.9 0.6 - 3.4   POC LYMPH PERCENT 30.5 10 - 50 %L   MID (cbc) 0.5 0 - 0.9   POC MID % 8.1 0 - 12 %M   POC Granulocyte 3.9 2 - 6.9   Granulocyte percent 61.4 37 - 80 %G   RBC 5.50 4.69 - 6.13 M/uL   Hemoglobin 15.5 14.1 - 18.1 g/dL   HCT, POC 16.1 09.6 - 53.7 %   MCV 85.7 80 - 97 fL   MCH, POC 28.2 27 - 31.2 pg   MCHC 32.9 31.8 - 35.4 g/dL   RDW, POC 04.5 %   Platelet Count, POC 212 142 - 424 K/uL   MPV 6.7 0 - 99.8 fL  POCT UA - Microscopic Only  Result Value Ref Range   WBC, Ur, HPF, POC neg    RBC, urine, microscopic 0-3    Bacteria, U Microscopic neg    Mucus, UA positive    Epithelial cells, urine per micros 0-2    Crystals, Ur, HPF, POC neg    Casts, Ur, LPF, POC neg    Yeast, UA neg   POCT urinalysis dipstick  Result Value Ref Range   Color, UA yellow    Clarity, UA clear    Glucose, UA 100    Bilirubin, UA neg    Ketones, UA neg    Spec Grav, UA 1.025    Blood, UA moderate    pH, UA 6.0    Protein, UA neg    Urobilinogen, UA 0.2    Nitrite, UA neg    Leukocytes, UA Negative Negative   EKG/XRAY:   Primary read interpreted by Dr. Cleta Alberts at [facility]. No definite stone seen  ASSESSMENT/PLAN: CT neg. Will add CK and Bmet. Recheck hematuria this weekend.I personally performed the services described in this documentation, which was  scribed in my presence. The recorded information has been reviewed and is accurate.  Earl Lites, MD Gross sideeffects, risk and benefits, and alternatives of medications d/w patient. Patient is aware that all medications have potential sideeffects and we are unable to predict every sideeffect or drug-drug interaction that may occur.    Lesle Chris MD 09/06/2014 1:58 PM

## 2014-09-06 NOTE — Telephone Encounter (Signed)
Gave patient results. He will followup this weekend and repeat UA.Given cautions for fever and watch for rash. Recheck sooner if worsening

## 2014-09-06 NOTE — Addendum Note (Signed)
Addended by: Lesle Chris A on: 09/06/2014 05:43 PM   Modules accepted: Orders

## 2014-09-07 LAB — BASIC METABOLIC PANEL WITH GFR
BUN: 14 mg/dL (ref 7–25)
CO2: 28 mmol/L (ref 20–31)
Calcium: 9.3 mg/dL (ref 8.6–10.3)
Chloride: 101 mmol/L (ref 98–110)
Creat: 0.92 mg/dL (ref 0.60–1.35)
GFR, Est Non African American: >89 mL/min (ref >=60)
GLUCOSE: 79 mg/dL (ref 65–99)
POTASSIUM: 4 mmol/L (ref 3.5–5.3)
Sodium: 139 mmol/L (ref 135–146)

## 2014-09-07 LAB — CK: Total CK: 54 U/L (ref 7–232)

## 2014-12-09 ENCOUNTER — Emergency Department (HOSPITAL_COMMUNITY)
Admission: EM | Admit: 2014-12-09 | Discharge: 2014-12-10 | Disposition: A | Discharge status: Home / Self Care | Payer: 59 | Attending: Emergency Medicine | Admitting: Emergency Medicine

## 2014-12-09 ENCOUNTER — Encounter (HOSPITAL_COMMUNITY): Payer: Self-pay | Admitting: Emergency Medicine

## 2014-12-09 DIAGNOSIS — R1084 Generalized abdominal pain: Secondary | ICD-10-CM | POA: Diagnosis not present

## 2014-12-09 DIAGNOSIS — R112 Nausea with vomiting, unspecified: Secondary | ICD-10-CM | POA: Insufficient documentation

## 2014-12-09 MED ORDER — PROMETHAZINE HCL 25 MG/ML IJ SOLN
25.0000 mg | Freq: Once | INTRAMUSCULAR | Status: AC
  Administered 2014-12-09: 25 mg via INTRAVENOUS
  Filled 2014-12-09: qty 1

## 2014-12-09 MED ORDER — MORPHINE SULFATE (PF) 4 MG/ML IV SOLN
4.0000 mg | Freq: Once | INTRAVENOUS | Status: AC
  Administered 2014-12-09: 4 mg via INTRAVENOUS
  Filled 2014-12-09: qty 1

## 2014-12-09 MED ORDER — SODIUM CHLORIDE 0.9 % IV BOLUS (SEPSIS)
1000.0000 mL | Freq: Once | INTRAVENOUS | Status: AC
  Administered 2014-12-09: 1000 mL via INTRAVENOUS

## 2014-12-09 NOTE — ED Notes (Signed)
Pt from home via EMS states he ate too much and has had 10/10 cramping and vomiting since 1600. Pt is dry heaving during assessment. Pt was given zofran in route, but states it did not help. Pt is refusing to let staff get a blood pressure manually and his automatic blood pressure was not accurate due to him clenching his arm.   Pt states he felt this way when he had a hiatal hernia- "he threw up and couldn't make his stomach stop spasming".

## 2014-12-09 NOTE — ED Provider Notes (Signed)
CSN: [id number]     Arrival date & time 12/09/14  2323 History   First MD Initiated Contact with Patient 12/09/14 2337     No chief complaint on file.  HPI   34 year old male presents today with nausea vomiting abdominal pain. Patient reports that he had Thanksgiving dinner at 7311 and then shortly after had another dinner with his family. He reports approximately 2 hours after eating he started to develop nausea and vomiting, with associated abdominal cramping. He reports that abdominal cramping is improved after the vomiting. He reports the vomiting is nonbloody, with several episodes. He was given Zofran prior to arrival with no improvement of symptoms. Patient denies any recent history of the same, does report a remote history of previous episode causing similar symptoms with resulting hospital admission due to intractable nausea and vomiting with no identifiable source.  Patient denies any fever, lower abdominal pain, diarrhea, or anyone eating similar foods with similar symptoms.  No past medical history on file. History reviewed. No pertinent past surgical history. No family history on file. Social History  Substance Use Topics  . Smoking status: Never Smoker   . Smokeless tobacco: Current User  . Alcohol Use: No    Review of Systems  All other systems reviewed and are negative.   Allergies  Review of patient's allergies indicates no known allergies.  Home Medications   Prior to Admission medications   Medication Sig Start Date End Date Taking? Authorizing Provider  butalbital-acetaminophen-caffeine (ESGIC) 50-325-40 MG per tablet Take 1-2 tablets by mouth 2 (two) times daily as needed for headache. Patient not taking: Reported on 09/06/2014 01/18/13   Tonye Pearsonobert P Doolittle, MD  cyclobenzaprine (FLEXERIL) 10 MG tablet Take 1 tablet (10 mg total) by mouth 3 (three) times daily. Patient not taking: Reported on 09/06/2014 07/15/13   Carmelina DaneJeffery S Anderson, MD  eletriptan (RELPAX) 40 MG tablet  Take 1 tablet (40 mg total) by mouth as needed for migraine or headache. One tablet by mouth at onset of headache. May repeat in 2 hours if headache persists or recurs. Patient not taking: Reported on 09/06/2014 01/30/13   Carmelina DaneJeffery S Anderson, MD  SUMAtriptan (IMITREX) 100 MG tablet Take 1 tablet within 15-30 minutes of the onset of a headache. No more than 2 tablets in 24 hours Patient not taking: Reported on 09/06/2014 01/18/13   Tonye Pearsonobert P Doolittle, MD   BP 120/74 mmHg  Pulse 63  Temp(Src) 97.7 F (36.5 C) (Oral)  Resp 20  Ht 5\' 6"  (1.676 m)  Wt 62.596 kg  BMI 22.28 kg/m2  SpO2 100%    Physical Exam  Constitutional: He is oriented to person, place, and time. He appears well-developed and well-nourished.  HENT:  Head: Normocephalic and atraumatic.  Eyes: Conjunctivae are normal. Pupils are equal, round, and reactive to light. Right eye exhibits no discharge. Left eye exhibits no discharge. No scleral icterus.  Neck: Normal range of motion. No JVD present. No tracheal deviation present.  Cardiovascular: Normal rate, regular rhythm, normal heart sounds and intact distal pulses.  Exam reveals no gallop and no friction rub.   No murmur heard. Pulmonary/Chest: Effort normal and breath sounds normal. No stridor. No respiratory distress. He has no wheezes. He has no rales. He exhibits no tenderness.  Abdominal: Soft. He exhibits no distension and no mass. There is no tenderness. There is no rebound and no guarding.  Musculoskeletal: Normal range of motion. He exhibits no edema or tenderness.  Neurological: He is alert and oriented  to person, place, and time. Coordination normal.  Skin: Skin is warm and dry. No rash noted. No erythema. No pallor.  Psychiatric: He has a normal mood and affect. His behavior is normal. Judgment and thought content normal.  Nursing note and vitals reviewed.    ED Course  Procedures (including critical care time) Labs Review Labs Reviewed  CBC WITH  DIFFERENTIAL/PLATELET  COMPREHENSIVE METABOLIC PANEL  LIPASE, BLOOD    Imaging Review No results found. I have personally reviewed and evaluated these images and lab results as part of my medical decision-making.   EKG Interpretation None      MDM   Final diagnoses:  None    Labs: CBC, CMP, lipase  Imaging: DG abdomen acute with chest in process  Consults:  Therapeutics: Phenergan, Haldol, normal saline, morphine   Discharge Meds:   Assessment/Plan: Patient presents with acute onset of nausea vomiting and abdominal pain. No other sick contacts, no fever, or bloody vomit. Pt given zofran with no improvement in symptoms, Zofran moderately improved symptoms, Haldol given. Patient's care transferred to oncoming provider pending by mouth challenge, pending labs, plain film and disposition.   Pt has no significant abdominal pain to palpation, low suspicion for perforation, pancreatitis.       Eyvonne Mechanic, PA-C 12/10/14 0105  Cy Blamer, MD 12/10/14 [phone]

## 2014-12-09 NOTE — ED Notes (Signed)
Pt has had abdominal pain since 1600. Pt has hx of hiatial hernia. He states he ate too much and has had episodes of emesis and pain since 1600. He had one normal bowel movement in that time. Pt got 4mg  of zofran in route via IV

## 2014-12-09 NOTE — ED Notes (Signed)
Bed: [id number] Expected date:  Expected time:  Means of arrival:  Comments: 34 yo abd pain

## 2014-12-10 ENCOUNTER — Emergency Department (HOSPITAL_COMMUNITY): Payer: 59

## 2014-12-10 LAB — CBC WITH DIFFERENTIAL/PLATELET
Basophils Absolute: 0 10*3/uL (ref 0.0–0.1)
Basophils Relative: 0 %
Eosinophils Absolute: 0.1 10*3/uL (ref 0.0–0.7)
Eosinophils Relative: 1 %
HCT: 39.9 % (ref 39.0–52.0)
Hemoglobin: 14.3 g/dL (ref 13.0–17.0)
Lymphocytes Relative: 10 %
Lymphs Abs: 0.9 10*3/uL (ref 0.7–4.0)
MCH: 29.5 pg (ref 26.0–34.0)
MCHC: 35.8 g/dL (ref 30.0–36.0)
MCV: 82.3 fL (ref 78.0–100.0)
Monocytes Absolute: 0.5 10*3/uL (ref 0.1–1.0)
Monocytes Relative: 6 %
Neutro Abs: 7.6 10*3/uL (ref 1.7–7.7)
Neutrophils Relative %: 83 %
Platelets: 214 10*3/uL (ref 150–400)
RBC: 4.85 MIL/uL (ref 4.22–5.81)
RDW: 12.3 % (ref 11.5–15.5)
WBC: 9.1 10*3/uL (ref 4.0–10.5)

## 2014-12-10 LAB — COMPREHENSIVE METABOLIC PANEL
ALT: 19 U/L (ref 17–63)
AST: 19 U/L (ref 15–41)
Albumin: 4.8 g/dL (ref 3.5–5.0)
Alkaline Phosphatase: 56 U/L (ref 38–126)
Anion gap: 11 (ref 5–15)
BUN: 14 mg/dL (ref 6–20)
CO2: 24 mmol/L (ref 22–32)
Calcium: 9.6 mg/dL (ref 8.9–10.3)
Chloride: 107 mmol/L (ref 101–111)
Creatinine, Ser: 0.97 mg/dL (ref 0.61–1.24)
GFR calc Af Amer: >60 mL/min (ref >60)
GFR calc non Af Amer: >60 mL/min (ref >60)
Glucose, Bld: 136 mg/dL — ABNORMAL HIGH (ref 65–99)
Potassium: 3.8 mmol/L (ref 3.5–5.1)
Sodium: 142 mmol/L (ref 135–145)
Total Bilirubin: 1.7 mg/dL — ABNORMAL HIGH (ref 0.3–1.2)
Total Protein: 7.4 g/dL (ref 6.5–8.1)

## 2014-12-10 LAB — LIPASE, BLOOD: Lipase: 23 U/L (ref 11–51)

## 2014-12-10 MED ORDER — ONDANSETRON 4 MG PO TBDP: 4.0000 mg | ORAL_TABLET | Freq: Three times a day (TID) | ORAL | Status: DC | PRN

## 2014-12-10 MED ORDER — HALOPERIDOL LACTATE 5 MG/ML IJ SOLN
2.0000 mg | Freq: Once | INTRAMUSCULAR | Status: AC
  Administered 2014-12-10: 2 mg via INTRAVENOUS
  Filled 2014-12-10: qty 1

## 2014-12-10 MED ORDER — LORAZEPAM 2 MG/ML IJ SOLN
1.0000 mg | Freq: Once | INTRAMUSCULAR | Status: AC
  Administered 2014-12-10: 1 mg via INTRAVENOUS
  Filled 2014-12-10: qty 1

## 2014-12-10 MED ORDER — DICYCLOMINE HCL 10 MG PO CAPS
10.0000 mg | ORAL_CAPSULE | Freq: Once | ORAL | Status: AC
  Administered 2014-12-10: 10 mg via ORAL
  Filled 2014-12-10: qty 1

## 2014-12-10 MED ORDER — DICYCLOMINE HCL 20 MG PO TABS
20.0000 mg | ORAL_TABLET | Freq: Two times a day (BID) | ORAL | Status: DC
Start: 2014-12-10 — End: 2016-02-02

## 2014-12-10 MED ORDER — KETOROLAC TROMETHAMINE 30 MG/ML IJ SOLN
30.0000 mg | Freq: Once | INTRAMUSCULAR | Status: AC
  Administered 2014-12-10: 30 mg via INTRAVENOUS
  Filled 2014-12-10: qty 1

## 2014-12-10 NOTE — ED Notes (Signed)
Pt had episode of emesis.  

## 2014-12-10 NOTE — ED Provider Notes (Signed)
After patient medicated.  He has slept for a short period of time.  He states now that his pain is returning.  No longer has any nausea or vomiting, is tolerating by mouth fluids.  I will give him IV Toradol and reassess Patient states that he has still having abdominal discomfort doesn't feel that he go home unless "knock him out".  He does look better.  His color has improved.  He is no longer vomiting.  I will try by mouth Bentyl and reassess  Earley FavorGail Cimberly Stoffel, NP 12/13/14 1950  Cy BlamerApril Palumbo, MD 12/25/14 [phone]

## 2014-12-10 NOTE — ED Notes (Addendum)
Pt states he was throwing up again. The emesis bag was empty. Pt states his "pain is improving, but his nausea is getting worse".

## 2014-12-10 NOTE — Discharge Instructions (Signed)
You were seen in the ER today for abdominal pain, nausea, and vomiting. All of your workup looks normal. I will give you a prescription for zofran for nausea and bentyl for abdominal pain. Please call one of the clinics below to establish primary care and follow up within one week.  Take medications as prescribed. Return to the emergency room for worsening condition or new concerning symptoms. Follow up with your regular doctor. If you don't have a regular doctor use one of the numbers below to establish a primary care doctor.   Emergency Department Resource Guide 1) Find a Doctor and Pay Out of Pocket Although you won't have to find out who is covered by your insurance plan, it is a good idea to ask around and get recommendations. You will then need to call the office and see if the doctor you have chosen will accept you as a new patient and what types of options they offer for patients who are self-pay. Some doctors offer discounts or will set up payment plans for their patients who do not have insurance, but you will need to ask so you aren't surprised when you get to your appointment.  2) Contact Your Local Health Department Not all health departments have doctors that can see patients for sick visits, but many do, so it is worth a call to see if yours does. If you don't know where your local health department is, you can check in your phone book. The CDC also has a tool to help you locate your state's health department, and many state websites also have listings of all of their local health departments.  3) Find a Walk-in Clinic If your illness is not likely to be very severe or complicated, you may want to try a walk in clinic. These are popping up all over the country in pharmacies, drugstores, and shopping centers. They're usually staffed by nurse practitioners or physician assistants that have been trained to treat common illnesses and complaints. They're usually fairly quick and inexpensive.  However, if you have serious medical issues or chronic medical problems, these are probably not your best option.  No Primary Care Doctor: - Call Health Connect at  [phone] - they can help you locate a primary care doctor that  accepts your insurance, provides certain services, etc. - Physician Referral [phone]  Emergency Department Resource Guide 1) Find a Doctor and Pay Out of Pocket Although you won't have to find out who is covered by your insurance plan, it is a good idea to ask around and get recommendations. You will then need to call the office and see if the doctor you have chosen will accept you as a new patient and what types of options they offer for patients who are self-pay. Some doctors offer discounts or will set up payment plans for their patients who do not have insurance, but you will need to ask so you aren't surprised when you get to your appointment.  2) Contact Your Local Health Department Not all health departments have doctors that can see patients for sick visits, but many do, so it is worth a call to see if yours does. If you don't know where your local health department is, you can check in your phone book. The CDC also has a tool to help you locate your state's health department, and many state websites also have listings of all of their local health departments.  3) Find a Walk-in Clinic If your illness is not  likely to be very severe or complicated, you may want to try a walk in clinic. These are popping up all over the country in pharmacies, drugstores, and shopping centers. They're usually staffed by nurse practitioners or physician assistants that have been trained to treat common illnesses and complaints. They're usually fairly quick and inexpensive. However, if you have serious medical issues or chronic medical problems, these are probably not your best option.  No Primary Care Doctor: - Call Health Connect at  [phone] - they can help you locate  a primary care doctor that  accepts your insurance, provides certain services, etc. - Physician Referral Service- [phone]  Chronic Pain Problems: Organization         Address  Phone   Notes  [facility]  [phone] Patients need to be referred by their primary care doctor.   Medication Assistance: Organization         Address  Phone   Notes  [facility] [address] [place], [place] [address] [phone] --Must be a resident of [facility] -- Must have NO insurance coverage whatsoever (no Medicaid/ Medicare, etc.) -- The pt. MUST have a primary care doctor that directs their care regularly and follows them in the community   Med[facility]ist  [phone]   [employer]  [phone]    Agencies that provide inexpensive medical care: Organization         Address  Phone   Notes  [facility]  [phone]) [phone]   [facility]    [phone]   [facility] [address] [place], [place] [address] [phone]) [phone]   [facility] [place] [address], [place] [phone]) [phone]   [facility]    [phone]   [facility]    [phone]) [phone]   [facility]  [address], [place] Phone:  [phone], Fax:  [phone] Hours of Operation:  9 am - 6 pm, M-F.  Also accepts Medicaid/Medicare and self-pay.  [facility]  [address], [place] Phone: [phone]) [phone], Fax: [phone]. Hours of Operation:  8:30 am - 5:30 pm, M-F.  Also accepts Medicaid and self-pay.  [facility] [place] [address], [place] Phone: [phone]) [phone]   [facility] [address], [place], [place] [phone], Ext. [phone] Mondays & Thursdays: 7-9 AM.  First 15 patients are seen on a first come, first serve basis.    Medicaid-accepting [facility]  Providers:  Organization         Address  Phone   Notes  [facility] [address], [place] [phone] Also accepts self-pay patients.  [facility] [address], [place]  [phone](336) [phone]   [facility] [address], [place] [phone]) [phone]   [facility] [address] [place] [address], [place] [phone]) [phone]   Renaye RakersVeita Bland [address], [place]   [phone]) [phone] Only accepts [place] patients after they have their name applied to their card.   Self-Pay (no insurance) in [facility]:  Organization         Address  Phone   Notes  Sickle Cell Patients, [facility] [address], [place] [phone]   [facility] [address], [place] [phone]   [facility] [place]  [address], [place] [phone]) [phone]   [facility]/Dr. Osei-Bonsu  [address] [place] [address], [place] or [phone], [place] [phone]) [phone] Phone number for both [place] and [place] locations is the same.  [facility] [address], [place] [phone]   [facility] [address] [place] [address], [place] or [address] [phone]   [facility] [address], [place] [phone], phone; [phone], fax Sees patients 1st and 3rd Saturday of every month.  Must not qualify for public or private insurance (i.e. Medicaid, Medicare, [place] Health Choice, Veterans' Benefits)  Household income should be no more than 200% of the poverty level The clinic cannot treat you if you are pregnant or think you are pregnant  Sexually transmitted diseases are not treated at the clinic.

## 2014-12-10 NOTE — ED Notes (Signed)
Pt to xray

## 2014-12-10 NOTE — ED Notes (Signed)
Pt able to tolerate PO fluids.  

## 2016-02-02 ENCOUNTER — Encounter (HOSPITAL_BASED_OUTPATIENT_CLINIC_OR_DEPARTMENT_OTHER): Payer: Self-pay

## 2016-02-02 ENCOUNTER — Emergency Department (HOSPITAL_BASED_OUTPATIENT_CLINIC_OR_DEPARTMENT_OTHER): Payer: BLUE CROSS/BLUE SHIELD

## 2016-02-02 ENCOUNTER — Emergency Department (HOSPITAL_BASED_OUTPATIENT_CLINIC_OR_DEPARTMENT_OTHER)
Admission: EM | Admit: 2016-02-02 | Discharge: 2016-02-02 | Disposition: A | Discharge status: Home / Self Care | Payer: BLUE CROSS/BLUE SHIELD | Attending: Emergency Medicine | Admitting: Emergency Medicine

## 2016-02-02 DIAGNOSIS — Y9389 Activity, other specified: Secondary | ICD-10-CM | POA: Diagnosis not present

## 2016-02-02 DIAGNOSIS — Y92410 Unspecified street and highway as the place of occurrence of the external cause: Secondary | ICD-10-CM | POA: Diagnosis not present

## 2016-02-02 DIAGNOSIS — Y999 Unspecified external cause status: Secondary | ICD-10-CM | POA: Diagnosis not present

## 2016-02-02 DIAGNOSIS — M62830 Muscle spasm of back: Secondary | ICD-10-CM | POA: Insufficient documentation

## 2016-02-02 DIAGNOSIS — M545 Low back pain, unspecified: Secondary | ICD-10-CM

## 2016-02-02 DIAGNOSIS — F17220 Nicotine dependence, chewing tobacco, uncomplicated: Secondary | ICD-10-CM | POA: Insufficient documentation

## 2016-02-02 DIAGNOSIS — S3992XA Unspecified injury of lower back, initial encounter: Secondary | ICD-10-CM | POA: Diagnosis present

## 2016-02-02 DIAGNOSIS — M79642 Pain in left hand: Secondary | ICD-10-CM | POA: Insufficient documentation

## 2016-02-02 MED ORDER — CYCLOBENZAPRINE HCL 10 MG PO TABS
10.0000 mg | ORAL_TABLET | Freq: Once | ORAL | Status: AC
  Administered 2016-02-02: 10 mg via ORAL
  Filled 2016-02-02: qty 1

## 2016-02-02 MED ORDER — CYCLOBENZAPRINE HCL 10 MG PO TABS: 10.0000 mg | ORAL_TABLET | Freq: Three times a day (TID) | ORAL | 0 refills | Status: DC | PRN

## 2016-02-02 MED ORDER — NAPROXEN 500 MG PO TABS: 500.0000 mg | ORAL_TABLET | Freq: Two times a day (BID) | ORAL | 0 refills | Status: DC | PRN

## 2016-02-02 MED ORDER — NAPROXEN 250 MG PO TABS
500.0000 mg | ORAL_TABLET | Freq: Once | ORAL | Status: AC
  Administered 2016-02-02: 500 mg via ORAL
  Filled 2016-02-02: qty 2
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
## 2016-02-02 NOTE — ED Notes (Signed)
Pt teaching provided on medications that may cause drowsiness. Pt instructed not to drive or operate heavy machinery while taking the prescribed medication. Pt verbalized understanding.   
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
## 2016-02-02 NOTE — Discharge Instructions (Signed)
Take naprosyn as directed for inflammation and pain with tylenol for breakthrough pain and flexeril for muscle relaxation. Do not drive or operate machinery with muscle relaxant use. Ice to areas of soreness for the next 24 hours and then may move to heat, no more than 20 minutes at a time every hour for each. Ice and elevate your hand to help with pain/swelling. Expect to be sore for the next few days and follow up with primary care physician for recheck of ongoing symptoms in the next 1-2 weeks. Return to ER for emergent changing or worsening of symptoms.

## 2016-02-02 NOTE — ED Notes (Signed)
ED Provider at bedside. 

## 2016-02-12 ENCOUNTER — Emergency Department (HOSPITAL_BASED_OUTPATIENT_CLINIC_OR_DEPARTMENT_OTHER): Payer: BLUE CROSS/BLUE SHIELD

## 2016-02-12 ENCOUNTER — Emergency Department (HOSPITAL_BASED_OUTPATIENT_CLINIC_OR_DEPARTMENT_OTHER)
Admission: EM | Admit: 2016-02-12 | Discharge: 2016-02-12 | Disposition: A | Discharge status: Home / Self Care | Payer: BLUE CROSS/BLUE SHIELD | Attending: Emergency Medicine | Admitting: Emergency Medicine

## 2016-02-12 ENCOUNTER — Encounter (HOSPITAL_BASED_OUTPATIENT_CLINIC_OR_DEPARTMENT_OTHER): Payer: Self-pay | Admitting: Emergency Medicine

## 2016-02-12 DIAGNOSIS — S3992XA Unspecified injury of lower back, initial encounter: Secondary | ICD-10-CM | POA: Diagnosis present

## 2016-02-12 DIAGNOSIS — Y92410 Unspecified street and highway as the place of occurrence of the external cause: Secondary | ICD-10-CM | POA: Diagnosis not present

## 2016-02-12 DIAGNOSIS — Y999 Unspecified external cause status: Secondary | ICD-10-CM | POA: Diagnosis not present

## 2016-02-12 DIAGNOSIS — F17220 Nicotine dependence, chewing tobacco, uncomplicated: Secondary | ICD-10-CM | POA: Insufficient documentation

## 2016-02-12 DIAGNOSIS — M545 Low back pain: Secondary | ICD-10-CM | POA: Diagnosis not present

## 2016-02-12 DIAGNOSIS — Z79899 Other long term (current) drug therapy: Secondary | ICD-10-CM | POA: Insufficient documentation

## 2016-02-12 DIAGNOSIS — Y939 Activity, unspecified: Secondary | ICD-10-CM | POA: Insufficient documentation

## 2016-02-12 MED ORDER — KETOROLAC TROMETHAMINE 60 MG/2ML IM SOLN
30.0000 mg | Freq: Once | INTRAMUSCULAR | Status: AC
  Administered 2016-02-12: 30 mg via INTRAMUSCULAR
  Filled 2016-02-12: qty 2

## 2016-02-12 MED ORDER — DEXAMETHASONE SODIUM PHOSPHATE 10 MG/ML IJ SOLN
10.0000 mg | Freq: Once | INTRAMUSCULAR | Status: AC
  Administered 2016-02-12: 10 mg via INTRAMUSCULAR
  Filled 2016-02-12: qty 1

## 2016-02-12 MED ORDER — KETOROLAC TROMETHAMINE 10 MG PO TABS: 10.0000 mg | ORAL_TABLET | Freq: Four times a day (QID) | ORAL | 0 refills | Status: DC | PRN

## 2016-02-12 MED ORDER — PREDNISONE 20 MG PO TABS: 40.0000 mg | ORAL_TABLET | Freq: Every day | ORAL | 0 refills | Status: DC

## 2016-02-12 NOTE — ED Notes (Signed)
ED Provider at bedside. 
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
## 2016-02-12 NOTE — Discharge Instructions (Signed)
Please take your medications as prescribed. Do not take Toradol conjunction with ibuprofen and was similarly. Follow-up with your doctor for further evaluation if your symptoms. Return to ED for new or worsening symptoms as we discussed.

## 2016-02-12 NOTE — ED Notes (Signed)
Patient transported to X-ray 
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
## 2016-02-12 NOTE — ED Notes (Signed)
[facility] Was seen then for back pain. Pain improved with flexeril and naproxen at that time which helped some but is no longer working. Patient is able to ambulate unassisted. Denies any bowel or bladder issues. Wife at bedside.

## 2016-03-30 ENCOUNTER — Encounter (HOSPITAL_BASED_OUTPATIENT_CLINIC_OR_DEPARTMENT_OTHER): Payer: Self-pay

## 2016-03-30 ENCOUNTER — Emergency Department (HOSPITAL_BASED_OUTPATIENT_CLINIC_OR_DEPARTMENT_OTHER): Payer: BLUE CROSS/BLUE SHIELD

## 2016-03-30 ENCOUNTER — Inpatient Hospital Stay (HOSPITAL_BASED_OUTPATIENT_CLINIC_OR_DEPARTMENT_OTHER)
Admission: EM | Admit: 2016-03-30 | Discharge: 2016-04-03 | DRG: 552 | Disposition: A | Discharge status: Home / Self Care | Payer: BLUE CROSS/BLUE SHIELD | Attending: Internal Medicine | Admitting: Internal Medicine

## 2016-03-30 DIAGNOSIS — R109 Unspecified abdominal pain: Secondary | ICD-10-CM | POA: Diagnosis present

## 2016-03-30 DIAGNOSIS — K529 Noninfective gastroenteritis and colitis, unspecified: Secondary | ICD-10-CM | POA: Diagnosis present

## 2016-03-30 DIAGNOSIS — Y92410 Unspecified street and highway as the place of occurrence of the external cause: Secondary | ICD-10-CM

## 2016-03-30 DIAGNOSIS — E876 Hypokalemia: Secondary | ICD-10-CM | POA: Diagnosis present

## 2016-03-30 DIAGNOSIS — R740 Nonspecific elevation of levels of transaminase and lactic acid dehydrogenase [LDH]: Secondary | ICD-10-CM | POA: Diagnosis present

## 2016-03-30 DIAGNOSIS — F17220 Nicotine dependence, chewing tobacco, uncomplicated: Secondary | ICD-10-CM | POA: Diagnosis present

## 2016-03-30 DIAGNOSIS — N503 Cyst of epididymis: Secondary | ICD-10-CM | POA: Diagnosis present

## 2016-03-30 DIAGNOSIS — M545 Low back pain, unspecified: Secondary | ICD-10-CM

## 2016-03-30 DIAGNOSIS — E860 Dehydration: Secondary | ICD-10-CM | POA: Diagnosis present

## 2016-03-30 DIAGNOSIS — M5459 Other low back pain: Secondary | ICD-10-CM | POA: Diagnosis present

## 2016-03-30 DIAGNOSIS — Z79899 Other long term (current) drug therapy: Secondary | ICD-10-CM

## 2016-03-30 DIAGNOSIS — R112 Nausea with vomiting, unspecified: Secondary | ICD-10-CM | POA: Diagnosis present

## 2016-03-30 DIAGNOSIS — R1084 Generalized abdominal pain: Secondary | ICD-10-CM

## 2016-03-30 DIAGNOSIS — Z9114 Patient's other noncompliance with medication regimen: Secondary | ICD-10-CM

## 2016-03-30 DIAGNOSIS — R7401 Elevation of levels of liver transaminase levels: Secondary | ICD-10-CM | POA: Diagnosis present

## 2016-03-30 DIAGNOSIS — D72829 Elevated white blood cell count, unspecified: Secondary | ICD-10-CM | POA: Diagnosis present

## 2016-03-30 DIAGNOSIS — Z7952 Long term (current) use of systemic steroids: Secondary | ICD-10-CM

## 2016-03-30 DIAGNOSIS — N442 Benign cyst of testis: Secondary | ICD-10-CM

## 2016-03-30 HISTORY — DX: Calculus of kidney: N20.0

## 2016-03-30 HISTORY — DX: Acute gastric ulcer without hemorrhage or perforation: K25.3

## 2016-03-30 HISTORY — DX: Unspecified injury of lower back, initial encounter: S39.92XA

## 2016-03-30 LAB — CBC WITH DIFFERENTIAL/PLATELET
BASOS ABS: 0 10*3/uL (ref 0.0–0.1)
Basophils Relative: 0 %
Eosinophils Absolute: 0.1 10*3/uL (ref 0.0–0.7)
Eosinophils Relative: 1 %
HEMATOCRIT: 41.1 % (ref 39.0–52.0)
HEMOGLOBIN: 15 g/dL (ref 13.0–17.0)
Lymphocytes Relative: 23 %
Lymphs Abs: 3.2 10*3/uL (ref 0.7–4.0)
MCH: 29.5 pg (ref 26.0–34.0)
MCHC: 36.5 g/dL — ABNORMAL HIGH (ref 30.0–36.0)
MCV: 80.7 fL (ref 78.0–100.0)
MONOS PCT: 7 %
Monocytes Absolute: 1 10*3/uL (ref 0.1–1.0)
Neutro Abs: 9.7 10*3/uL — ABNORMAL HIGH (ref 1.7–7.7)
Neutrophils Relative %: 69 %
Platelets: 230 10*3/uL (ref 150–400)
RBC: 5.09 MIL/uL (ref 4.22–5.81)
RDW: 12.3 % (ref 11.5–15.5)
WBC: 14.1 10*3/uL — AB (ref 4.0–10.5)

## 2016-03-30 LAB — URINALYSIS, ROUTINE W REFLEX MICROSCOPIC
Glucose, UA: NEGATIVE mg/dL
HGB URINE DIPSTICK: NEGATIVE
KETONES UR: 15 mg/dL — AB
Leukocytes, UA: NEGATIVE
NITRITE: NEGATIVE
Protein, ur: 30 mg/dL — AB
SPECIFIC GRAVITY, URINE: 1.044 — AB (ref 1.005–1.030)
pH: 7.5 (ref 5.0–8.0)

## 2016-03-30 LAB — COMPREHENSIVE METABOLIC PANEL
ALK PHOS: 68 U/L (ref 38–126)
ALT: 48 U/L (ref 17–63)
AST: 45 U/L — ABNORMAL HIGH (ref 15–41)
Albumin: 4.5 g/dL (ref 3.5–5.0)
Anion gap: 12 (ref 5–15)
BILIRUBIN TOTAL: 1.1 mg/dL (ref 0.3–1.2)
BUN: 16 mg/dL (ref 6–20)
CALCIUM: 9.3 mg/dL (ref 8.9–10.3)
CO2: 21 mmol/L — ABNORMAL LOW (ref 22–32)
CREATININE: 1.04 mg/dL (ref 0.61–1.24)
Chloride: 106 mmol/L (ref 101–111)
GFR calc Af Amer: >60 mL/min (ref >60)
Glucose, Bld: 137 mg/dL — ABNORMAL HIGH (ref 65–99)
POTASSIUM: 3.4 mmol/L — AB (ref 3.5–5.1)
Sodium: 139 mmol/L (ref 135–145)
Total Protein: 7.4 g/dL (ref 6.5–8.1)

## 2016-03-30 LAB — URINALYSIS, MICROSCOPIC (REFLEX): WBC UA: NONE SEEN WBC/hpf (ref 0–5)

## 2016-03-30 LAB — RAPID URINE DRUG SCREEN, HOSP PERFORMED
Amphetamines: NOT DETECTED
Barbiturates: NOT DETECTED
Benzodiazepines: POSITIVE — AB
Cocaine: NOT DETECTED
OPIATES: NOT DETECTED
TETRAHYDROCANNABINOL: NOT DETECTED

## 2016-03-30 LAB — LIPASE, BLOOD: Lipase: 62 U/L — ABNORMAL HIGH (ref 11–51)

## 2016-03-30 MED ORDER — IOPAMIDOL (ISOVUE-300) INJECTION 61%
100.0000 mL | Freq: Once | INTRAVENOUS | Status: AC | PRN
  Administered 2016-03-30: 100 mL via INTRAVENOUS

## 2016-03-30 MED ORDER — MORPHINE SULFATE (PF) 4 MG/ML IV SOLN
4.0000 mg | Freq: Once | INTRAVENOUS | Status: AC
  Administered 2016-03-30: 4 mg via INTRAVENOUS
  Filled 2016-03-30: qty 1

## 2016-03-30 MED ORDER — SODIUM CHLORIDE 0.9 % IV BOLUS (SEPSIS)
1000.0000 mL | Freq: Once | INTRAVENOUS | Status: AC
  Administered 2016-03-30: 1000 mL via INTRAVENOUS

## 2016-03-30 MED ORDER — HYDROMORPHONE HCL 1 MG/ML IJ SOLN
0.5000 mg | Freq: Once | INTRAMUSCULAR | Status: AC
  Administered 2016-03-30: 0.5 mg via INTRAVENOUS
  Filled 2016-03-30: qty 1

## 2016-03-30 MED ORDER — PROMETHAZINE HCL 25 MG/ML IJ SOLN
12.5000 mg | Freq: Once | INTRAMUSCULAR | Status: AC
  Administered 2016-03-30: 12.5 mg via INTRAVENOUS
  Filled 2016-03-30: qty 1

## 2016-03-30 MED ORDER — DICYCLOMINE HCL 10 MG PO CAPS
20.0000 mg | ORAL_CAPSULE | Freq: Once | ORAL | Status: AC
  Administered 2016-03-30: 20 mg via ORAL
  Filled 2016-03-30: qty 2

## 2016-03-30 MED ORDER — SODIUM CHLORIDE 0.9 % IV BOLUS (SEPSIS): 1000.0000 mL | Freq: Once | INTRAVENOUS | Status: DC

## 2016-03-30 MED ORDER — ONDANSETRON HCL 4 MG/2ML IJ SOLN
4.0000 mg | Freq: Once | INTRAMUSCULAR | Status: AC
  Administered 2016-03-30: 4 mg via INTRAVENOUS
  Filled 2016-03-30: qty 2

## 2016-03-30 MED ORDER — LORAZEPAM 2 MG/ML IJ SOLN
1.0000 mg | Freq: Once | INTRAMUSCULAR | Status: AC
  Administered 2016-03-30: 1 mg via INTRAVENOUS
  Filled 2016-03-30: qty 1

## 2016-03-30 NOTE — ED Notes (Signed)
Called to pts room because of his SATS were 67% ??? Pt sleeping with hand under his head and laying flat. Raised head of bed to 30' and placed pt on 2lt [place]. SATS returned to 100%.
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
## 2016-03-30 NOTE — ED Notes (Signed)
Pt writhing in bed and grimacing before Dilaudid and Ativan given; lying quietly now, but continues to rate pain 10/10.

## 2016-03-30 NOTE — ED Triage Notes (Addendum)
Pt with lower back pain since January intermittent - actively vomiting "(brown/yellow/green)" during triage reports this is a pain response - states back pain worse today. Pt seen by PCP today and Toradol IM given @ noon today.

## 2016-03-31 ENCOUNTER — Observation Stay (HOSPITAL_COMMUNITY): Payer: BLUE CROSS/BLUE SHIELD

## 2016-03-31 DIAGNOSIS — N503 Cyst of epididymis: Secondary | ICD-10-CM | POA: Diagnosis present

## 2016-03-31 DIAGNOSIS — F17220 Nicotine dependence, chewing tobacco, uncomplicated: Secondary | ICD-10-CM | POA: Diagnosis present

## 2016-03-31 DIAGNOSIS — Z79899 Other long term (current) drug therapy: Secondary | ICD-10-CM | POA: Diagnosis not present

## 2016-03-31 DIAGNOSIS — K529 Noninfective gastroenteritis and colitis, unspecified: Secondary | ICD-10-CM | POA: Diagnosis present

## 2016-03-31 DIAGNOSIS — R109 Unspecified abdominal pain: Secondary | ICD-10-CM | POA: Diagnosis present

## 2016-03-31 DIAGNOSIS — R740 Nonspecific elevation of levels of transaminase and lactic acid dehydrogenase [LDH]: Secondary | ICD-10-CM | POA: Diagnosis present

## 2016-03-31 DIAGNOSIS — R112 Nausea with vomiting, unspecified: Secondary | ICD-10-CM | POA: Diagnosis not present

## 2016-03-31 DIAGNOSIS — Z7952 Long term (current) use of systemic steroids: Secondary | ICD-10-CM | POA: Diagnosis not present

## 2016-03-31 DIAGNOSIS — M545 Low back pain: Secondary | ICD-10-CM | POA: Diagnosis present

## 2016-03-31 DIAGNOSIS — E860 Dehydration: Secondary | ICD-10-CM | POA: Diagnosis present

## 2016-03-31 DIAGNOSIS — M5459 Other low back pain: Secondary | ICD-10-CM | POA: Diagnosis present

## 2016-03-31 DIAGNOSIS — E876 Hypokalemia: Secondary | ICD-10-CM | POA: Diagnosis present

## 2016-03-31 DIAGNOSIS — D72829 Elevated white blood cell count, unspecified: Secondary | ICD-10-CM | POA: Diagnosis present

## 2016-03-31 DIAGNOSIS — Y92410 Unspecified street and highway as the place of occurrence of the external cause: Secondary | ICD-10-CM | POA: Diagnosis not present

## 2016-03-31 DIAGNOSIS — Z9114 Patient's other noncompliance with medication regimen: Secondary | ICD-10-CM | POA: Diagnosis not present

## 2016-03-31 DIAGNOSIS — R7401 Elevation of levels of liver transaminase levels: Secondary | ICD-10-CM | POA: Diagnosis present

## 2016-03-31 DIAGNOSIS — R1084 Generalized abdominal pain: Secondary | ICD-10-CM

## 2016-03-31 LAB — MAGNESIUM: MAGNESIUM: 1.9 mg/dL (ref 1.7–2.4)

## 2016-03-31 LAB — LACTIC ACID, PLASMA: Lactic Acid, Venous: 2 mmol/L (ref 0.5–1.9)

## 2016-03-31 LAB — PHOSPHORUS: PHOSPHORUS: 3.6 mg/dL (ref 2.5–4.6)

## 2016-03-31 LAB — CK: Total CK: 109 U/L (ref 49–397)

## 2016-03-31 LAB — SEDIMENTATION RATE: Sed Rate: 2 mm/hr (ref 0–16)

## 2016-03-31 MED ORDER — ACETAMINOPHEN 650 MG RE SUPP: 650.0000 mg | Freq: Four times a day (QID) | RECTAL | Status: DC | PRN

## 2016-03-31 MED ORDER — PROCHLORPERAZINE EDISYLATE 5 MG/ML IJ SOLN
10.0000 mg | Freq: Four times a day (QID) | INTRAMUSCULAR | Status: DC | PRN
  Administered 2016-03-31 (×2): 10 mg via INTRAVENOUS
  Filled 2016-03-31 (×2): qty 2

## 2016-03-31 MED ORDER — LIDOCAINE 5 % EX PTCH
1.0000 | MEDICATED_PATCH | CUTANEOUS | Status: DC
  Administered 2016-03-31 – 2016-04-02 (×3): 1 via TRANSDERMAL
  Filled 2016-03-31 (×3): qty 1

## 2016-03-31 MED ORDER — GADOBENATE DIMEGLUMINE 529 MG/ML IV SOLN
15.0000 mL | Freq: Once | INTRAVENOUS | Status: AC
  Administered 2016-03-31: 14 mL via INTRAVENOUS

## 2016-03-31 MED ORDER — KETOROLAC TROMETHAMINE 15 MG/ML IJ SOLN
15.0000 mg | Freq: Four times a day (QID) | INTRAMUSCULAR | Status: DC
  Administered 2016-03-31 – 2016-04-03 (×13): 15 mg via INTRAVENOUS
  Filled 2016-03-31 (×14): qty 1

## 2016-03-31 MED ORDER — METOCLOPRAMIDE HCL 5 MG/ML IJ SOLN
5.0000 mg | Freq: Four times a day (QID) | INTRAMUSCULAR | Status: DC
  Administered 2016-03-31 – 2016-04-03 (×13): 5 mg via INTRAVENOUS
  Filled 2016-03-31 (×14): qty 2

## 2016-03-31 MED ORDER — ENOXAPARIN SODIUM 40 MG/0.4ML ~~LOC~~ SOLN
40.0000 mg | SUBCUTANEOUS | Status: DC
  Administered 2016-03-31 – 2016-04-03 (×4): 40 mg via SUBCUTANEOUS
  Filled 2016-03-31 (×4): qty 0.4

## 2016-03-31 MED ORDER — HYDROMORPHONE HCL 2 MG/ML IJ SOLN
1.0000 mg | Freq: Once | INTRAMUSCULAR | Status: AC
  Administered 2016-03-31: 1 mg via INTRAVENOUS
  Filled 2016-03-31: qty 1

## 2016-03-31 MED ORDER — POTASSIUM CHLORIDE 2 MEQ/ML IV SOLN
30.0000 meq | Freq: Once | INTRAVENOUS | Status: AC
  Administered 2016-03-31: 30 meq via INTRAVENOUS
  Filled 2016-03-31: qty 15

## 2016-03-31 MED ORDER — SODIUM CHLORIDE 0.9 % IV SOLN
INTRAVENOUS | Status: DC
  Administered 2016-03-31: 05:00:00 via INTRAVENOUS

## 2016-03-31 MED ORDER — ONDANSETRON HCL 4 MG/2ML IJ SOLN
4.0000 mg | Freq: Four times a day (QID) | INTRAMUSCULAR | Status: DC
  Administered 2016-03-31 – 2016-04-03 (×13): 4 mg via INTRAVENOUS
  Filled 2016-03-31 (×14): qty 2

## 2016-03-31 MED ORDER — ACETAMINOPHEN 325 MG PO TABS: 650.0000 mg | ORAL_TABLET | Freq: Four times a day (QID) | ORAL | Status: DC | PRN

## 2016-03-31 MED ORDER — HYDROMORPHONE HCL 2 MG/ML IJ SOLN
0.5000 mg | INTRAMUSCULAR | Status: DC | PRN
  Administered 2016-03-31: 1 mg via INTRAVENOUS
  Administered 2016-03-31: 0.5 mg via INTRAVENOUS
  Administered 2016-03-31 – 2016-04-02 (×10): 1 mg via INTRAVENOUS
  Filled 2016-03-31 (×12): qty 1

## 2016-03-31 MED ORDER — KCL IN DEXTROSE-NACL 20-5-0.9 MEQ/L-%-% IV SOLN
INTRAVENOUS | Status: DC
  Administered 2016-03-31 – 2016-04-02 (×7): via INTRAVENOUS
  Filled 2016-03-31 (×8): qty 1000

## 2016-03-31 MED ORDER — SODIUM CHLORIDE 0.9 % IV SOLN: INTRAVENOUS | Status: DC

## 2016-03-31 MED ORDER — KETOROLAC TROMETHAMINE 30 MG/ML IJ SOLN
30.0000 mg | Freq: Four times a day (QID) | INTRAMUSCULAR | Status: DC | PRN
  Administered 2016-03-31: 30 mg via INTRAVENOUS
  Filled 2016-03-31: qty 1

## 2016-03-31 MED ORDER — PANTOPRAZOLE SODIUM 40 MG IV SOLR
40.0000 mg | INTRAVENOUS | Status: DC
  Administered 2016-03-31 – 2016-04-02 (×3): 40 mg via INTRAVENOUS
  Filled 2016-03-31 (×3): qty 40

## 2016-03-31 MED ORDER — HYDROMORPHONE HCL 2 MG/ML IJ SOLN: 0.5000 mg | INTRAMUSCULAR | Status: DC | PRN

## 2016-03-31 MED ORDER — METOCLOPRAMIDE HCL 5 MG/ML IJ SOLN
10.0000 mg | Freq: Once | INTRAMUSCULAR | Status: AC
  Administered 2016-03-31: 10 mg via INTRAVENOUS
  Filled 2016-03-31: qty 2

## 2016-03-31 NOTE — Progress Notes (Signed)
Late entry- received a call at 0902 from Chales Abrahamsharlie Edens from lab with a critical lab value for Lactic Acid 2.0. Junious SilkAllison Ellis NP was notified immediately. No new orders were received.   Jesusita OkaLa Hoz, Aleyna Cueva n 03/31/16
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
## 2016-03-31 NOTE — Progress Notes (Addendum)
Mr. James Hines is a 36 year old male who present with back and abd pain with N/V. Reportedly given Zofran, Ativan, Dilaudid, Phenergan, and Reglan without relief of symptoms. Similar episode noted back in 2016. Workup reveals WBC 14.1, potassium 3.4, lipase 62, AST 45. UDS positive for benzos, but after given Ativan CT scan of the abdomen negative for any significant acute abnormalities to suggest cause for patient symptoms. Transfer for observation to a MedSurg bed. Patient on Tramadol for back pain as outpatient at least since Jan 2018.

## 2016-03-31 NOTE — Progress Notes (Signed)
Pt admitted to [phone] from [facility] ED. Pt a/o x3. PIV on right AC clean dry and intact. VSS. C/o abdominal/back of pain 9/10. Nauseated and dry heaving at this time. Made Dr. Katrinka BlazingSmith aware. Awaiting for orders.

## 2016-04-01 DIAGNOSIS — E876 Hypokalemia: Secondary | ICD-10-CM

## 2016-04-01 DIAGNOSIS — E860 Dehydration: Secondary | ICD-10-CM

## 2016-04-01 DIAGNOSIS — M545 Low back pain: Principal | ICD-10-CM

## 2016-04-01 DIAGNOSIS — N503 Cyst of epididymis: Secondary | ICD-10-CM

## 2016-04-01 LAB — COMPREHENSIVE METABOLIC PANEL
ALK PHOS: 36 U/L — AB (ref 38–126)
ALT: 40 U/L (ref 17–63)
AST: 37 U/L (ref 15–41)
Albumin: 3.1 g/dL — ABNORMAL LOW (ref 3.5–5.0)
Anion gap: 4 — ABNORMAL LOW (ref 5–15)
BILIRUBIN TOTAL: 0.5 mg/dL (ref 0.3–1.2)
BUN: 15 mg/dL (ref 6–20)
CALCIUM: 8 mg/dL — AB (ref 8.9–10.3)
CO2: 23 mmol/L (ref 22–32)
CREATININE: 1 mg/dL (ref 0.61–1.24)
Chloride: 114 mmol/L — ABNORMAL HIGH (ref 101–111)
Glucose, Bld: 114 mg/dL — ABNORMAL HIGH (ref 65–99)
Potassium: 3.3 mmol/L — ABNORMAL LOW (ref 3.5–5.1)
Sodium: 141 mmol/L (ref 135–145)
TOTAL PROTEIN: 5 g/dL — AB (ref 6.5–8.1)

## 2016-04-01 LAB — HEPATITIS PANEL, ACUTE
HEP A IGM: NEGATIVE
HEP B C IGM: NEGATIVE
Hepatitis B Surface Ag: NEGATIVE

## 2016-04-01 LAB — CBC
HCT: 30.7 % — ABNORMAL LOW (ref 39.0–52.0)
Hemoglobin: 10.6 g/dL — ABNORMAL LOW (ref 13.0–17.0)
MCH: 29 pg (ref 26.0–34.0)
MCHC: 34.5 g/dL (ref 30.0–36.0)
MCV: 83.9 fL (ref 78.0–100.0)
PLATELETS: 126 10*3/uL — AB (ref 150–400)
RBC: 3.66 MIL/uL — ABNORMAL LOW (ref 4.22–5.81)
RDW: 12.7 % (ref 11.5–15.5)
WBC: 6.8 10*3/uL (ref 4.0–10.5)

## 2016-04-01 LAB — LACTIC ACID, PLASMA: LACTIC ACID, VENOUS: 1.6 mmol/L (ref 0.5–1.9)

## 2016-04-01 LAB — HIV ANTIBODY (ROUTINE TESTING W REFLEX): HIV Screen 4th Generation wRfx: NONREACTIVE

## 2016-04-01 MED ORDER — GI COCKTAIL ~~LOC~~
30.0000 mL | Freq: Three times a day (TID) | ORAL | Status: DC | PRN
  Administered 2016-04-01: 30 mL via ORAL
  Filled 2016-04-01 (×2): qty 30
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
## 2016-04-02 MED ORDER — POTASSIUM CHLORIDE CRYS ER 20 MEQ PO TBCR: 40.0000 meq | EXTENDED_RELEASE_TABLET | Freq: Two times a day (BID) | ORAL | Status: DC

## 2016-04-02 MED ORDER — PANTOPRAZOLE SODIUM 40 MG PO TBEC
40.0000 mg | DELAYED_RELEASE_TABLET | Freq: Every day | ORAL | Status: DC
  Administered 2016-04-03: 40 mg via ORAL
  Filled 2016-04-02: qty 1

## 2016-04-02 MED ORDER — TRAMADOL HCL 50 MG PO TABS
50.0000 mg | ORAL_TABLET | Freq: Four times a day (QID) | ORAL | Status: DC | PRN
  Administered 2016-04-02: 50 mg via ORAL
  Filled 2016-04-02: qty 1

## 2016-04-02 MED ORDER — SODIUM CHLORIDE 0.9 % IV SOLN
INTRAVENOUS | Status: DC
  Administered 2016-04-02: 14:00:00 via INTRAVENOUS

## 2016-04-02 MED ORDER — TRAMADOL HCL 50 MG PO TABS
100.0000 mg | ORAL_TABLET | Freq: Four times a day (QID) | ORAL | Status: DC | PRN
  Administered 2016-04-02 – 2016-04-03 (×2): 100 mg via ORAL
  Filled 2016-04-02 (×2): qty 2
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
## 2016-04-02 NOTE — Care Management Note (Signed)
Case Management Note  Patient Details  Name: James Hines MRN: [id number] Date of Birth: 10/14/1980  Subjective/Objective:                    Action/Plan: PT recommends outpatient PT. Discussed with patient.  PCP is Dr Hulan Amatoliffon Baker [phone], [address], [place] , [place]  Patient states he is in [place] every day and [facility]  [address], [place] , [place] [address] phone [phone]. [facility], [facility] will [facility] for appointment. Patient has contact information.   Expected Discharge Date:                  Expected Discharge Plan:  Home/Self Care  In-House Referral:     Discharge planning Services     Post Acute Care Choice:    Choice offered to:  Patient  DME Arranged:    DME Agency:     HH Arranged:    HH Agency:     Status of Service:  Completed, signed off  If discussed at [employer] Length of Stay Meetings, dates discussed:    Additional Comments:  Kingsley PlanWile, [name], RN 04/02/2016, 10:01 AM

## 2016-04-03 LAB — BASIC METABOLIC PANEL
Anion gap: 5 (ref 5–15)
BUN: 5 mg/dL — AB (ref 6–20)
CALCIUM: 8.1 mg/dL — AB (ref 8.9–10.3)
CO2: 27 mmol/L (ref 22–32)
Chloride: 106 mmol/L (ref 101–111)
Creatinine, Ser: 0.88 mg/dL (ref 0.61–1.24)
GFR calc Af Amer: >60 mL/min (ref >60)
Glucose, Bld: 83 mg/dL (ref 65–99)
POTASSIUM: 3.6 mmol/L (ref 3.5–5.1)
SODIUM: 138 mmol/L (ref 135–145)

## 2016-04-03 MED ORDER — PANTOPRAZOLE SODIUM 40 MG PO TBEC: 40.0000 mg | DELAYED_RELEASE_TABLET | Freq: Every day | ORAL | 0 refills | Status: AC

## 2016-04-03 MED ORDER — KETOROLAC TROMETHAMINE 10 MG PO TABS
10.0000 mg | ORAL_TABLET | Freq: Four times a day (QID) | ORAL | 0 refills | Status: AC | PRN
Start: 2016-04-03 — End: ?
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
## 2016-04-03 NOTE — Progress Notes (Signed)
Discharge home. Home discharge instruction given, no question verbalized. 

## 2016-04-04 LAB — BLOOD CULTURE ID PANEL (REFLEXED)
ACINETOBACTER BAUMANNII: NOT DETECTED
CANDIDA ALBICANS: NOT DETECTED
CANDIDA KRUSEI: NOT DETECTED
Candida glabrata: NOT DETECTED
Candida parapsilosis: NOT DETECTED
Candida tropicalis: NOT DETECTED
ENTEROBACTERIACEAE SPECIES: NOT DETECTED
ENTEROCOCCUS SPECIES: NOT DETECTED
ESCHERICHIA COLI: NOT DETECTED
Enterobacter cloacae complex: NOT DETECTED
Haemophilus influenzae: NOT DETECTED
KLEBSIELLA OXYTOCA: NOT DETECTED
Klebsiella pneumoniae: NOT DETECTED
LISTERIA MONOCYTOGENES: NOT DETECTED
Neisseria meningitidis: NOT DETECTED
PSEUDOMONAS AERUGINOSA: NOT DETECTED
Proteus species: NOT DETECTED
SERRATIA MARCESCENS: NOT DETECTED
STAPHYLOCOCCUS AUREUS BCID: NOT DETECTED
STREPTOCOCCUS PNEUMONIAE: NOT DETECTED
STREPTOCOCCUS PYOGENES: NOT DETECTED
STREPTOCOCCUS SPECIES: NOT DETECTED
Staphylococcus species: NOT DETECTED
Streptococcus agalactiae: NOT DETECTED

## 2016-04-05 LAB — CULTURE, BLOOD (ROUTINE X 2): CULTURE: NO GROWTH

## 2016-04-06 LAB — CULTURE, BLOOD (ROUTINE X 2)

## 2017-05-05 IMAGING — US US SCROTUM
1 series · 14 of 25 positions shown · non-contrast
Comparison: CT 03/30/2016

CLINICAL DATA: Possible testicular cyst on CT yesterday.

EXAM:
ULTRASOUND OF SCROTUM
TECHNIQUE: Complete ultrasound examination of the testicles, epididymis, and
other scrotal structures was performed.

[Series 1: us scrotum · 0.07mm/px · 14 of 48 slices shown]
[im 1/48]
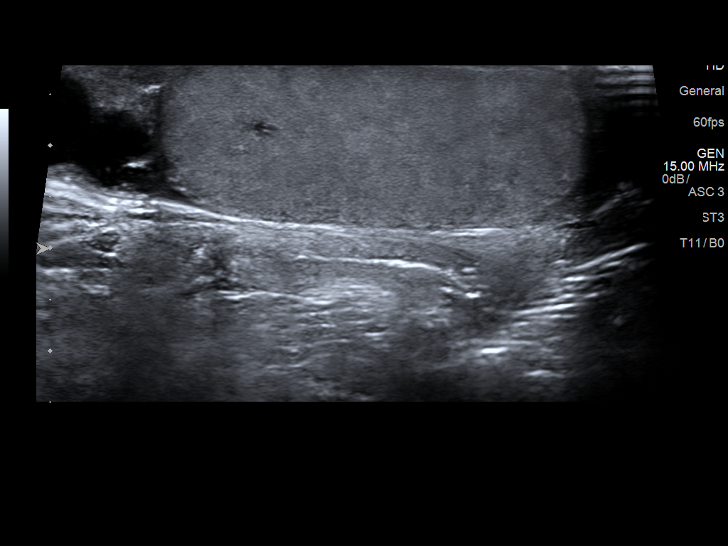
[im 4/48]
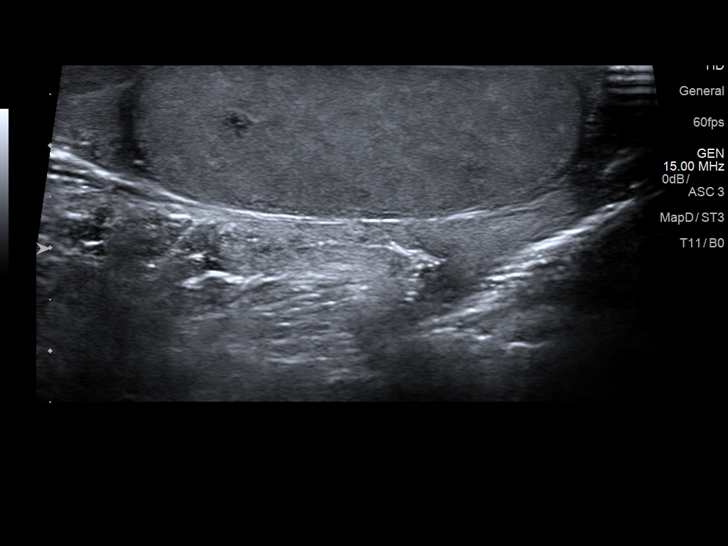
[im 8/48]
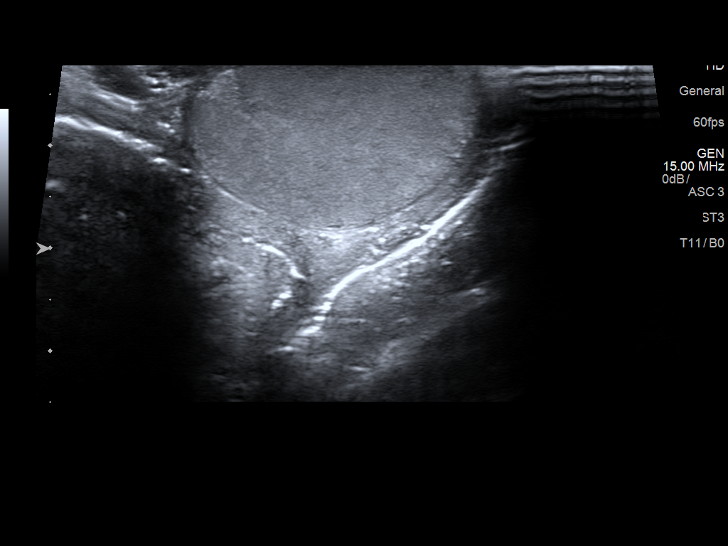
[im 12/48]
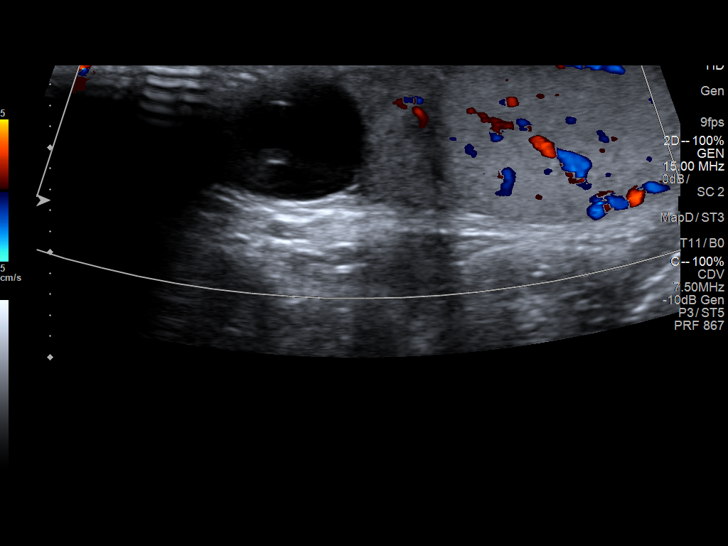
[im 16/48]
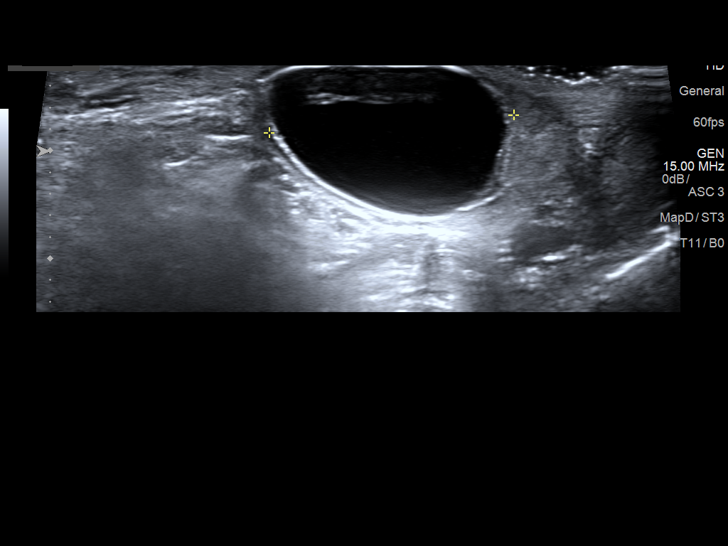
[im 18/48]
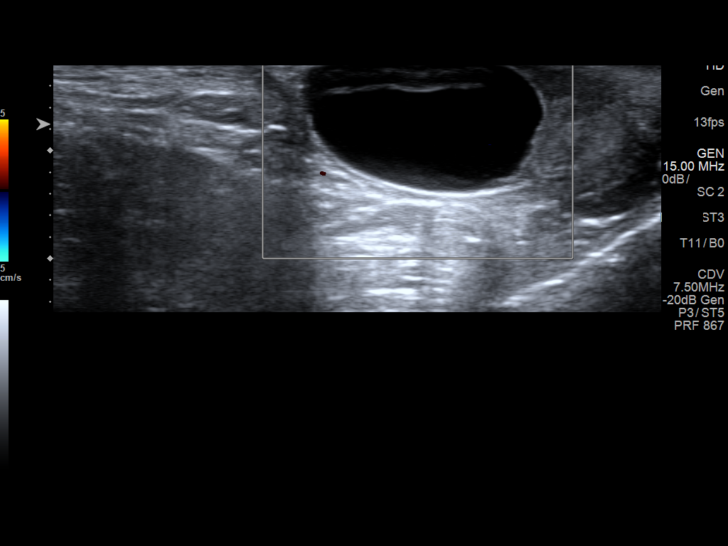
[im 22/48]
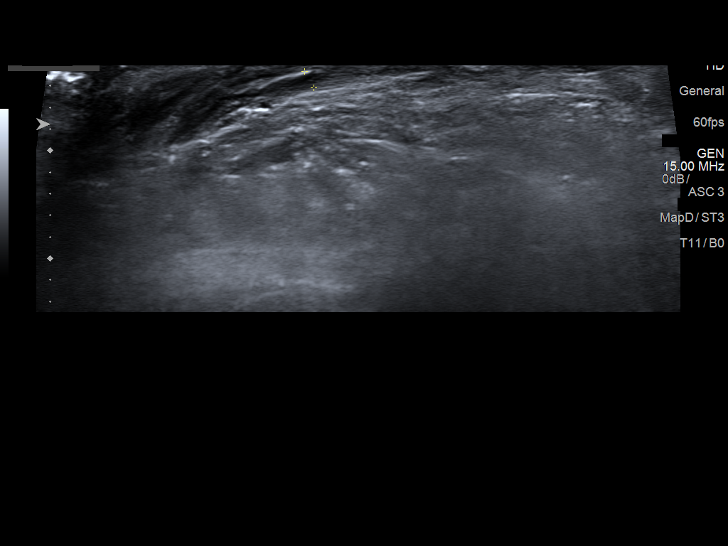
[im 26/48]
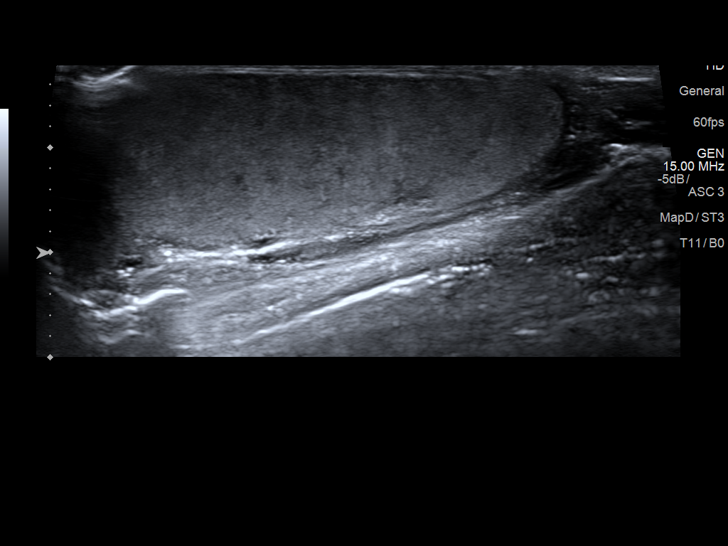
[im 30/48]
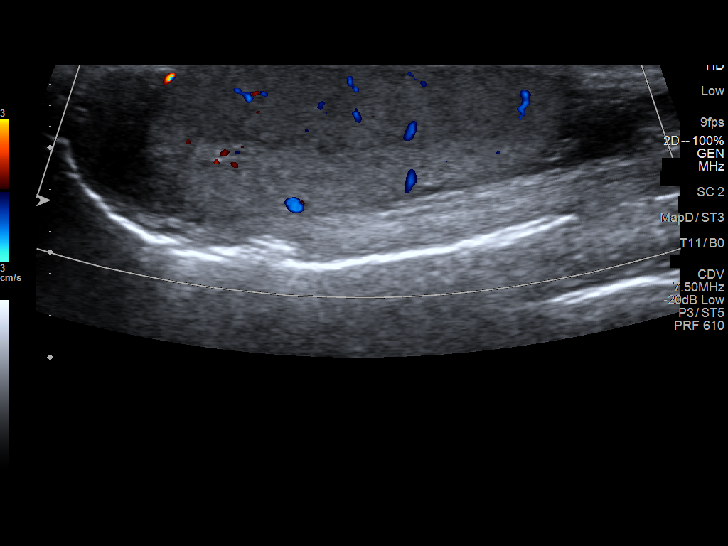
[im 32/48]
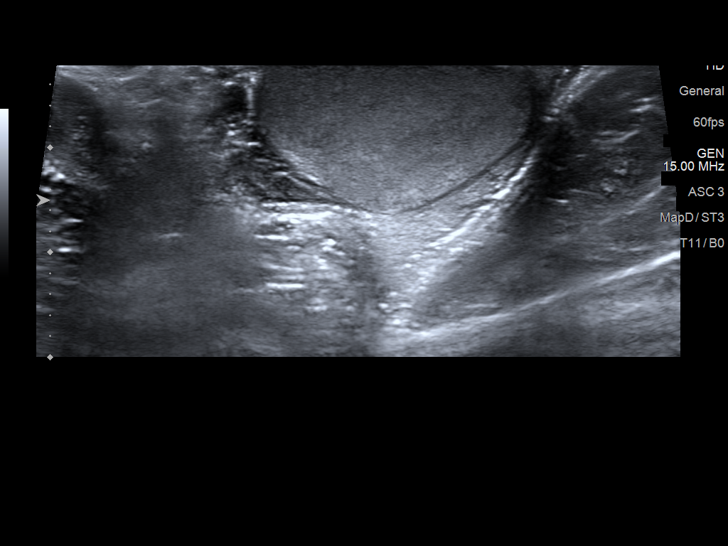
[im 36/48]
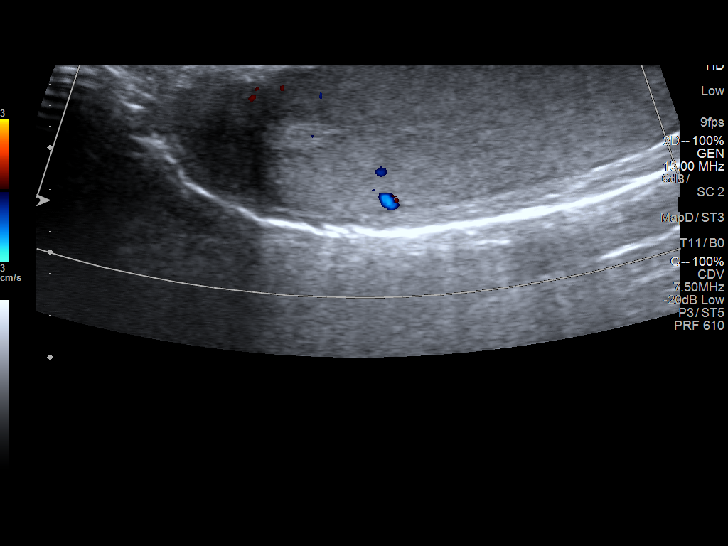
[im 40/48]
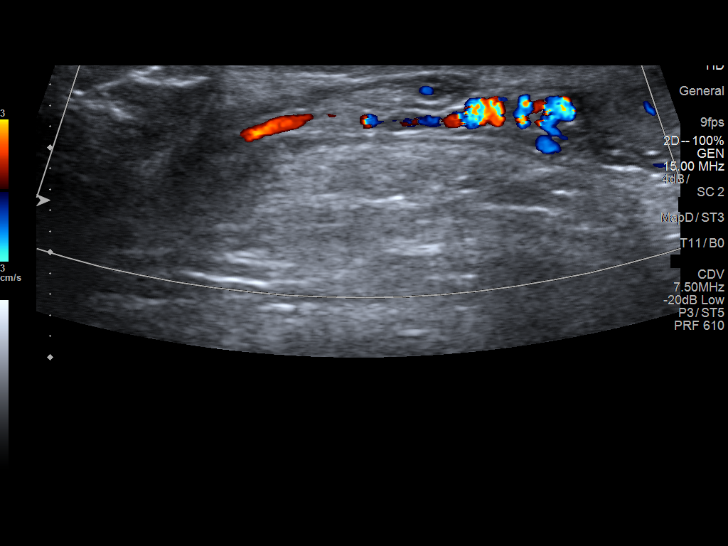
[im 44/48]
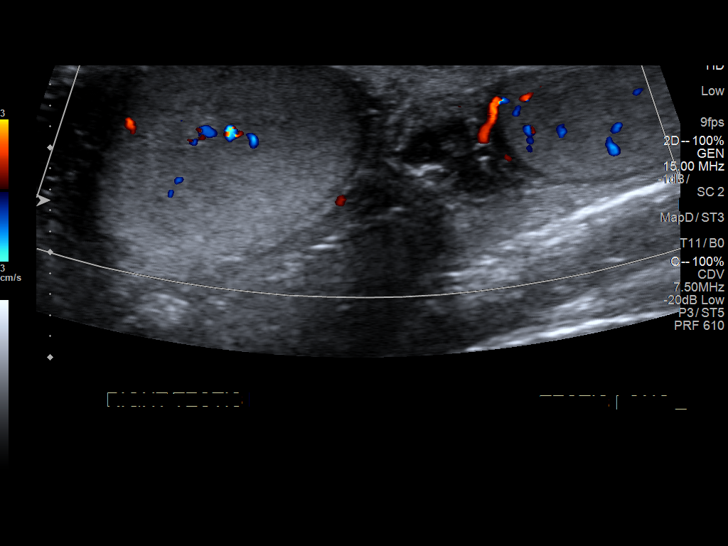
[im 48/48]
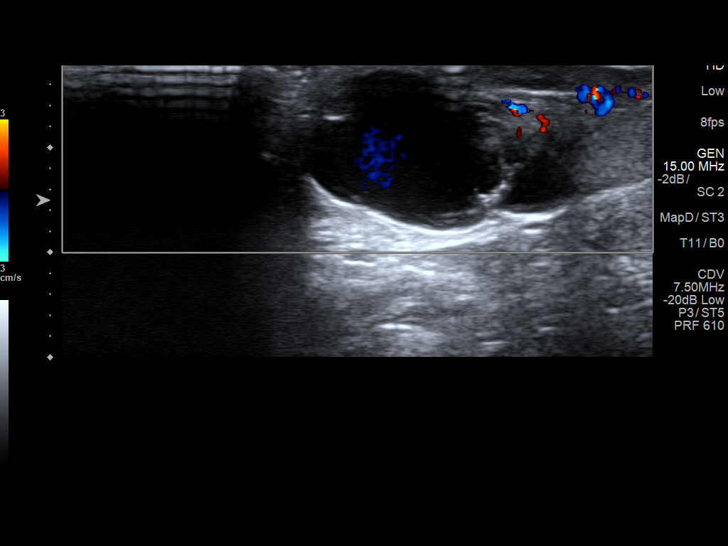

[14 of 25 positions shown; findings below may reference images not displayed]

FINDINGS: Right testicle

Measurements: 1.5 x 2.8 x 4.4 cm. No mass or microlithiasis
visualized. Normal color flow.

Left testicle

Measurements: 1.5 x 2.7 x 4.3 cm. No mass or microlithiasis
visualized. Normal color flow.

Right epididymis: Minimally complicated cyst over the head of the
right epididymis measuring 3.3 cm in greatest diameter. No solid
component.

Left epididymis:  Normal in size and appearance.

Hydrocele:  None visualized.

Varicocele:  None visualized.
IMPRESSION: Normal symmetric testicles.

3.3 cm minimally complicated cyst over the head of the right
epididymis.
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
